# Patient Record
Sex: Female | Born: 1995 | Race: White | Hispanic: No | Marital: Single | Smoking: Never smoker
Health system: Southern US, Community
[De-identification: ages and names within clinical notes are randomized; demographics above are authoritative.]

## PROBLEM LIST (undated history)

## (undated) DIAGNOSIS — J45909 Unspecified asthma, uncomplicated: Secondary | ICD-10-CM

## (undated) HISTORY — PX: TONSILLECTOMY: SUR1361

---
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
## 2017-01-26 ENCOUNTER — Encounter: Payer: Self-pay | Admitting: Emergency Medicine

## 2017-01-26 ENCOUNTER — Emergency Department (INDEPENDENT_AMBULATORY_CARE_PROVIDER_SITE_OTHER): Payer: BLUE CROSS/BLUE SHIELD

## 2017-01-26 ENCOUNTER — Emergency Department (INDEPENDENT_AMBULATORY_CARE_PROVIDER_SITE_OTHER)
Admission: EM | Admit: 2017-01-26 | Discharge: 2017-01-26 | Disposition: A | Discharge status: Home / Self Care | Payer: BLUE CROSS/BLUE SHIELD | Source: Home / Self Care | Attending: Family Medicine | Admitting: Family Medicine

## 2017-01-26 DIAGNOSIS — M79671 Pain in right foot: Secondary | ICD-10-CM

## 2017-01-26 DIAGNOSIS — M79674 Pain in right toe(s): Secondary | ICD-10-CM | POA: Diagnosis not present

## 2017-01-26 DIAGNOSIS — R1032 Left lower quadrant pain: Secondary | ICD-10-CM

## 2017-01-26 LAB — POCT URINALYSIS DIP (MANUAL ENTRY)
BILIRUBIN UA: NEGATIVE mg/dL
Bilirubin, UA: NEGATIVE
Blood, UA: NEGATIVE
Glucose, UA: NEGATIVE mg/dL
LEUKOCYTES UA: NEGATIVE
Nitrite, UA: POSITIVE — AB
Protein Ur, POC: NEGATIVE mg/dL
Spec Grav, UA: 1.02 (ref 1.010–1.025)
Urobilinogen, UA: 0.2 E.U./dL
pH, UA: 7 (ref 5.0–8.0)

## 2017-01-26 LAB — POCT URINE PREGNANCY: Preg Test, Ur: NEGATIVE

## 2017-01-26 NOTE — ED Provider Notes (Signed)
[facility]    CSN: [id number] Arrival date & time: 01/26/17  1155     History   Chief Complaint Chief Complaint  Patient presents with  . Foot Pain  . Abdominal Pain    HPI Karen Huffman is a 21 y.o. female.   Patient presents with two complaints: 1)  She has had pain in the medial aspect of her right great toe for about three weeks.  She recalls no injury to her toe. 2)  She complains of vague intermittent left lower abdominal pain/cramps for about 5 weeks.  The pain seems to be worse with certain movements.  She denies urinary symptoms.  No fevers, chills, and sweats.  No nausea/vomiting.  No vaginal discharge.  Patient's last menstrual period was 01/15/2017.     The history is provided by the patient and a parent.    Past Medical History:  Diagnosis Date  . Asthma     There are no active problems to display for this patient.   Past Surgical History:  Procedure Laterality Date  . TONSILLECTOMY      OB History    No data available       Home Medications    Prior to Admission medications   Not on File    Family History Family History  Problem Relation Age of Onset  . Narcolepsy Mother     Social History Social History  Substance Use Topics  . Smoking status: Never Smoker  . Smokeless tobacco: Never Used  . Alcohol use No     Allergies   Patient has no known allergies.   Review of Systems Review of Systems  Constitutional: Negative for activity change, appetite change, chills, diaphoresis, fatigue and fever.  HENT: Negative.   Eyes: Negative.   Respiratory: Negative.   Cardiovascular: Negative.   Gastrointestinal: Positive for abdominal pain. Negative for abdominal distention, blood in stool, constipation, diarrhea, nausea, rectal pain and vomiting.  Genitourinary: Negative.   Musculoskeletal:       Right great toe pain     Physical Exam Triage Vital Signs ED Triage Vitals  Enc Vitals Group     BP 01/26/17 1247  110/72     Pulse Rate 01/26/17 1247 65     Resp --      Temp 01/26/17 1247 98.3 F (36.8 C)     Temp Source 01/26/17 1247 Oral     SpO2 01/26/17 1247 100 %     Weight 01/26/17 1247 125 lb (56.7 kg)     Height 01/26/17 1247 5\' 11"  (1.803 m)     Head Circumference --      Peak Flow --      Pain Score 01/26/17 1248 8     Pain Loc --      Pain Edu? --      Excl. in GC? --    No data found.   Updated Vital Signs BP 110/72 (BP Location: Left Arm)   Pulse 65   Temp 98.3 F (36.8 C) (Oral)   Ht 5\' 11"  (1.803 m)   Wt 125 lb (56.7 kg)   LMP 01/15/2017   SpO2 100%   BMI 17.43 kg/m   Visual Acuity Right Eye Distance:   Left Eye Distance:   Bilateral Distance:    Right Eye Near:   Left Eye Near:    Bilateral Near:     Physical Exam  Constitutional: She appears well-developed and well-nourished. No distress.  HENT:  Head: Normocephalic.  Eyes: Conjunctivae are normal. Pupils are equal, round, and reactive to light.  Neck: Normal range of motion.  Cardiovascular: Normal heart sounds.   Pulmonary/Chest: Breath sounds normal.  Abdominal: Soft. Bowel sounds are normal. She exhibits no distension and no mass. There is tenderness. There is no rebound and no guarding. No hernia.    There is almost point tenderness in patient's left lower abdomen as noted on diagram.   I can reproduce her pain only by palpation of her lower left abdomen during contraction of her rectus abdominus muscles.   Musculoskeletal:       Feet:  There is distinct tenderness to palpation over the medial aspect of the right first MTP joint.  There is no swelling, erythema, or warmth.  Toe has full range of motion.  Skin: Skin is warm and dry.  Nursing note and vitals reviewed.    UC Treatments / Results  Labs (all labs ordered are listed, but only abnormal results are displayed) Labs Reviewed  POCT URINALYSIS DIP (MANUAL ENTRY) - Abnormal; Notable for the following:       Result Value   Clarity, UA  cloudy (*)    Nitrite, UA Positive (*)    All other components within normal limits  POCT URINE PREGNANCY negative    EKG  EKG Interpretation None       Radiology Dg Foot Complete Right  Result Date: 01/26/2017 CLINICAL DATA:  Initial evaluation for acute pain.  No injury. EXAM: RIGHT FOOT COMPLETE - 3+ VIEW COMPARISON:  None. FINDINGS: Small focus of heterotopic calcification seen at the medial aspect of the right first MTP joint. No significant associated soft tissue swelling. No underlying erosive or osteoarthritic changes within the adjacent right first MTP joint. No other acute osseous abnormality. No fracture or dislocation. No other soft tissue abnormality. Osseous mineralization normal. IMPRESSION: 1. Small focus of heterotopic calcification at the medial aspect of the right first MTP joint. Finding is nonspecific, with primary differential considerations including changes related to calcific tendinopathy, bursitis, or sequelae of remote trauma. No associated osteoarthritic or erosive changes within the underlying right first MTP joint. 2. Otherwise negative radiograph of the right foot. Electronically Signed   By: Rise MuBenjamin  McClintock M.D.   On: 01/26/2017 13:36    Procedures Procedures (including critical care time)  Medications Ordered in UC Medications - No data to display   Initial Impression / Assessment and Plan / UC Course  I have reviewed the triage vital signs and the nursing notes.  Pertinent labs & imaging results that were available during my care of the patient were reviewed by me and considered in my medical decision making (see chart for details).    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 Note positive nitrites on urinalysis; doubt UTI however (patient specifically denies urinary symptoms). Suspect that patient's left lower abdominal pain is musculoskeletal:  I can reproduce her pain only by palpation of her lower left abdomen  during contraction of her rectus abdominus muscles.  Small hernia also a possibility. May take Ibuprofen 200mg , 3 or 4 tabs every 8 hours with food, as needed. Followup with Dr. Rodney Langtonhomas Thekkekandam or Dr. Clementeen GrahamEvan Corey ([facility]) as soon as possible for further evaluation.    Final Clinical Impressions(s) / UC Diagnoses   Final diagnoses:  Pain of right great toe  Abdominal wall pain in left lower quadrant    New Prescriptions New Prescriptions   No medications on file  Lattie Haw, MD 02/05/17 1147

## 2017-01-26 NOTE — Discharge Instructions (Signed)
May take Ibuprofen 200mg, 3 or 4 tabs every 8 hours with food, as needed. °

## 2017-01-26 NOTE — ED Triage Notes (Signed)
[facility]. C/O LLQ Pain, also reports urinary symptoms as well. Patient reports pain in Right Great Toe, medial side.

## 2017-01-27 ENCOUNTER — Ambulatory Visit (INDEPENDENT_AMBULATORY_CARE_PROVIDER_SITE_OTHER): Payer: BLUE CROSS/BLUE SHIELD | Admitting: Sports Medicine

## 2017-01-27 ENCOUNTER — Encounter: Payer: Self-pay | Admitting: Sports Medicine

## 2017-01-27 DIAGNOSIS — M79674 Pain in right toe(s): Secondary | ICD-10-CM | POA: Insufficient documentation

## 2017-01-27 DIAGNOSIS — R1032 Left lower quadrant pain: Secondary | ICD-10-CM

## 2017-01-27 LAB — POCT URINALYSIS DIPSTICK
Bilirubin, UA: NEGATIVE
Glucose, UA: NEGATIVE
Nitrite, UA: NEGATIVE
Protein, UA: NEGATIVE
Spec Grav, UA: 1.025 (ref 1.010–1.025)
Urobilinogen, UA: 0.2 U/dL
pH, UA: 6 (ref 5.0–8.0)

## 2017-01-27 LAB — POCT URINE PREGNANCY: Preg Test, Ur: NEGATIVE

## 2017-01-27 MED ORDER — NITROFURANTOIN MONOHYD MACRO 100 MG PO CAPS: 100.0000 mg | ORAL_CAPSULE | Freq: Two times a day (BID) | ORAL | 0 refills | Status: DC

## 2017-01-27 MED ORDER — MELOXICAM 15 MG PO TABS: ORAL_TABLET | ORAL | 3 refills | Status: DC

## 2017-01-27 NOTE — Progress Notes (Signed)
   Subjective:    I'm seeing this patient as a consultation for:  Dr. Donna ChristenStephen Beese  CC: right great toe pain, abdominal wall pain  HPI: Presents with right great toe pain for 1 month. Does not recall a specific injury but states that she may have stubbed her toe and doesn't remember. She denies any swelling to significant pain to the lateral aspect of her first MTP on the right.  She was seen in urgent care yesterday and had foot x-rays which showed a heterotrophic calcification lateral to the first MTP joint.  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  She also has left lower quadrant pain that is been ongoing for the past month or so. She reports that last month and began about a week or so after her menses started.  LMP 01/15/17 and she is very regular.  She denies any urinary symptoms, hematuria, vaginal discharge.    Past medical history:  Negative.  See flowsheet/record as well for more information.  Surgical history: Negative.  See flowsheet/record as well for more information.  Family history: Negative.  See flowsheet/record as well for more information.  Social history: Negative.  See flowsheet/record as well for more information.  Allergies, and medications have been entered into the medical record, reviewed, and no changes needed.   Review of Systems: No headache, visual changes, nausea, vomiting, diarrhea, constipation, dizziness, abdominal pain, skin rash, fevers, chills, night sweats, weight loss, swollen lymph nodes, body aches, joint swelling, muscle aches, chest pain, shortness of breath, mood changes, visual or auditory hallucinations.   Objective:   General: Well Developed, well nourished, and in no acute distress.  Neuro/Psych: Alert and oriented x3, extra-ocular muscles intact, able to move all 4 extremities, sensation grossly intact. Skin: Warm and dry, no rashes noted.  Respiratory: Not using accessory muscles, speaking in full sentences, trachea midline.    Cardiovascular: Pulses palpable, no extremity edema. Abdomen: Does not appear distended. +TTP in LLQ, no rebound or guarding.  No abdominal wall pain.  No pain with resisted hip flexion. Negative CVA tenderness bilaterally  Ankle & Foot: No visible swelling, ecchymosis, erythema, ulcers, calluses, blister. Has protrusion of right 1st MTP Arch: Normal w/o pes cavus or planus  TTP at right lateral MTP joint Full in plantarflexion, dorsiflexion, inversion, and eversion of the foot; flexion and extension of the toes Strength: 5/5 in all directions. Sensation: intact Vascular: intact w/ dorsalis pedis & posterior tibialis pulses 2+    Impression and Recommendations:   This case required medical decision making of moderate complexity.  Abdominal pain, acute, left lower quadrant Symptoms are consistent with a gynecologic process, likely ovarian cyst. She did have some leukocytes and blood in her urinalysis, we will send this for culture. Urine pregnancy test is negative. Adding Macrobid. I'm also going to add a pelvic and transvaginal ultrasound.  Great toe pain, right Meloxicam, postop shoe, return in one month for this.

## 2017-01-27 NOTE — Assessment & Plan Note (Signed)
Symptoms are consistent with a gynecologic process, likely ovarian cyst. She did have some leukocytes and blood in her urinalysis, we will send this for culture. Urine pregnancy test is negative. Adding Macrobid. I'm also going to add a pelvic and transvaginal ultrasound.

## 2017-01-27 NOTE — Addendum Note (Signed)
Addended by: Baird KayUGLAS, Tyren Dugar M on: 01/27/2017 04:08 PM   Modules accepted: Orders

## 2017-01-27 NOTE — Assessment & Plan Note (Signed)
Meloxicam, postop shoe, return in one month for this.

## 2017-01-29 ENCOUNTER — Ambulatory Visit (INDEPENDENT_AMBULATORY_CARE_PROVIDER_SITE_OTHER): Payer: BLUE CROSS/BLUE SHIELD

## 2017-01-29 DIAGNOSIS — R1032 Left lower quadrant pain: Secondary | ICD-10-CM

## 2017-01-30 LAB — URINE CULTURE

## 2018-02-04 DIAGNOSIS — Z6820 Body mass index (BMI) 20.0-20.9, adult: Secondary | ICD-10-CM | POA: Diagnosis not present

## 2018-02-04 DIAGNOSIS — Z0000 Encounter for general adult medical examination without abnormal findings: Secondary | ICD-10-CM | POA: Diagnosis not present

## 2018-07-06 DIAGNOSIS — M7552 Bursitis of left shoulder: Secondary | ICD-10-CM | POA: Diagnosis not present

## 2018-09-01 DIAGNOSIS — J069 Acute upper respiratory infection, unspecified: Secondary | ICD-10-CM | POA: Diagnosis not present

## 2018-09-01 DIAGNOSIS — M542 Cervicalgia: Secondary | ICD-10-CM | POA: Diagnosis not present

## 2018-09-01 DIAGNOSIS — M545 Low back pain: Secondary | ICD-10-CM | POA: Diagnosis not present

## 2018-09-14 ENCOUNTER — Other Ambulatory Visit: Payer: Self-pay

## 2018-09-14 ENCOUNTER — Ambulatory Visit: Payer: BLUE CROSS/BLUE SHIELD | Attending: Orthopedic Surgery | Admitting: Physical Therapy

## 2018-09-14 DIAGNOSIS — R293 Abnormal posture: Secondary | ICD-10-CM | POA: Insufficient documentation

## 2018-09-14 DIAGNOSIS — M546 Pain in thoracic spine: Secondary | ICD-10-CM | POA: Diagnosis not present

## 2018-09-14 NOTE — Patient Instructions (Addendum)

## 2018-09-14 NOTE — Therapy (Signed)
[facility] [place] [address]  [address] [place], [place], [address] Phone: [phone]   Fax:  [phone]  Physical Therapy Evaluation  Patient Details  Name: Karen Huffman MRN: [id number] Date of Birth: 1995-12-16 Referring Provider (PT): Venita Lickahari Brooks, MD   Encounter Date: 09/14/2018  PT End of Session - 09/14/18 1307    Visit Number  1    Number of Visits  4    Date for PT Re-Evaluation  09/28/18    PT Start Time  1307    PT Stop Time  1358    PT Time Calculation (min)  51 min    Activity Tolerance  Patient tolerated treatment well    Behavior During Therapy  [facility] for tasks assessed/performed       Past Medical History:  Diagnosis Date  . Asthma     Past Surgical History:  Procedure Laterality Date  . TONSILLECTOMY      There were no vitals filed for this visit.   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 Starting last May, she starting noting pain in L scapular region. Tried taking a break from working out but pain came back when she went back to working out.  Has had random days with severe pain (up to 8/10) w/o known trigger. Pain has been better recently (1-2 months) but has been avoiding upper body work-out. Reports she also injured her neck while lifting ~2 months ago but saw a chiropracter and was better w/in ~1.5 wks.    Currently in Pain?  Yes    Pain Score  2     Pain Location  Thoracic    Pain Orientation  Left;Medial    Pain Descriptors / Indicators  Discomfort    Pain Type  Chronic pain    Pain Radiating Towards  n/a    Pain Onset  More than a month ago    Pain Frequency  Intermittent    Aggravating Factors   rows & lat pull downs, massage    Pain Relieving Factors  ice & heat    Effect of Pain on Daily Activities  limits upper body work-out         [facility] - 09/14/18 1307      Assessment   Medical Diagnosis  L  periscapular thoracic pain    Referring Provider (PT)  Venita Lickahari Brooks, MD    Onset Date/Surgical Date  --   May 2019   Hand Dominance  Right    Next MD Visit  after finished PT?    Prior Therapy  none      Balance Screen   Has the patient fallen in the past 6 months  No    Has the patient had a decrease in activity level because of a fear of falling?   No    Is the patient reluctant to leave their home because of a fear of falling?   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); wedding videographer    Leisure  working out (lifting - max weights up to 200# with deadlift & squats, upper body free weights 20-30#)      Posture/Postural Control   Posture/Postural Control  Postural limitations    Posture Comments  slightly  elevated fwd L shoulder relative to R      ROM / Strength   AROM / PROM / Strength  AROM;Strength      AROM   Overall AROM   Within functional limits for tasks performed    Overall AROM Comments  B shoulder ROM WNL    AROM Assessment Site  Cervical;Thoracic;Shoulder    Cervical Flexion  70    Cervical Extension  62    Cervical - Right Side Bend  31    Cervical - Left Side Bend  32    Cervical - Right Rotation  59    Cervical - Left Rotation  58      Strength   Strength Assessment Site  Shoulder    Right/Left Shoulder  Right;Left    Right Shoulder Flexion  5/5    Right Shoulder Extension  5/5    Right Shoulder ABduction  5/5    Right Shoulder Internal Rotation  5/5    Right Shoulder External Rotation  4+/5    Left Shoulder Flexion  5/5    Left Shoulder Extension  5/5    Left Shoulder ABduction  5/5    Left Shoulder Internal Rotation  5/5    Left Shoulder External Rotation  4+/5      Palpation   Palpation comment  Increased muscle tension & ttp in L upper/mid/lower traps, LS, thoracic paraspinals, rhomboids & pecs with multiple TPs  and taut band identified.                 Objective measurements completed on examination: See above findings.      [facility] Treatment/Exercise - 09/14/18 1307      Exercises   Exercises  Shoulder      Shoulder Exercises: Standing   Retraction  Both;10 reps;Theraband;Strengthening    Theraband Level (Shoulder Retraction)  Level 3 (Green)    Retraction Limitations  scap retraction + mini shoulder extension      Shoulder Exercises: Lawyertretch   Corner Stretch  30 seconds;1 rep   IT sales professionaleach   Corner Stretch Limitations  3 way doorway stretch    Cross Chest Stretch  30 seconds;2 reps    Cross Chest Stretch Limitations  L posterior capsule stretch    Other Shoulder Stretches  B rhomboid/middle & lower trap stretch in doorway (varying arm position) 3 x 30 sec    Other Shoulder Stretches  L UT stretch with slight overpressure 2 x 30 sec             PT Education - 09/14/18 1358    Education Details  PT eval findings, anticipated POC, info on DN & initial HEP    Person(s) Educated  Patient    Methods  Explanation;Demonstration;Handout    Comprehension  Verbalized understanding;Returned demonstration;Need further instruction          PT Long Term Goals - 09/14/18 1400      PT LONG TERM GOAL #1   Title  Independent with ongoing HEP    Status  New    Target Date  09/28/18      PT LONG TERM GOAL #2   Title  Patient to demonstrate appropriate posture and body mechanics needed for daily activities    Status  New    Target Date  09/28/18      PT LONG TERM GOAL #3   Title  Patient to return to working out with light weights/resisitance w/o limitation due to L periscapular thoracic pain  Status  New    Target Date  09/28/18             Plan - 09/14/18 1403    Clinical Impression Statement  Karen Huffman is a 22 y/o female who presents to OP PT for L periscapular thoracic pain originating in May 2019. Pain first noticed while working out during lifting  activities and machine exercises such as inclined rows and lat pull downs. Improved during rest break from working out but returned when she went back to lifting. She also reports random "episodes" where she will have pain up to 8/10 w/o triggering events. She does report recent instance of L sided neck strain also from working out/lifting but states this resolved after ~1.5 weeks following chiropractic adjustment. Cervical ROM slightly limited in side bending and rotation but B shoulder ROM WNL. B UE strength symmetrical with only mild weakness in B shoulder ER with decreased scapular activation noted with this as well as rows. Increased muscle tension & ttp in L upper/mid/lower traps, LS, thoracic paraspinals, rhomboids & pecs with multiple TPs and taut band identified. Averlee will benefit from skilled PT to address deficits listed to allow her to resume normal daily activities and workouts with decreased pain interference. Therapy duration limited due to returning to school at [employer] on 09/27/18 - will assess need for continued PT while at school at that time.    Clinical Presentation  Stable    Clinical Decision Making  Low    Rehab Potential  Good    Clinical Impairments Affecting Rehab Potential  limited time frame until pt returns to school at [employer]    PT Frequency  2x / week    PT Duration  2 weeks   pt returning to school at [employer] as of 09/27/18   PT Treatment/Interventions  Patient/family education;Neuromuscular re-education;Therapeutic exercise;Therapeutic activities;ADLs/Self Care Home Management;Manual techniques;Dry needling;Taping;Electrical Stimulation;Moist Heat;Cryotherapy;Iontophoresis 4mg /ml Dexamethasone    Consulted and Agree with Plan of Care  Patient       Patient will benefit from skilled therapeutic intervention in order to improve the following deficits and impairments:  Pain, Increased muscle spasms, Impaired flexibility, Postural dysfunction, Improper body mechanics, Impaired UE  functional use  Visit Diagnosis: Pain in thoracic spine  Abnormal posture     Problem List Patient Active Problem List   Diagnosis Date Noted  . Great toe pain, right 01/27/2017  . Abdominal pain, acute, left lower quadrant 01/27/2017    Marry Guan, PT, MPT 09/14/2018, 2:37 PM  [facility] [address]  [address] [place], [place], [address] Phone: [phone]   Fax:  [phone]  Name: Karen Huffman MRN: [id number] Date of Birth: 1996/01/29

## 2018-09-18 ENCOUNTER — Ambulatory Visit: Payer: BLUE CROSS/BLUE SHIELD | Attending: Orthopedic Surgery | Admitting: Physical Therapy

## 2018-09-18 ENCOUNTER — Encounter: Payer: Self-pay | Admitting: Physical Therapy

## 2018-09-18 DIAGNOSIS — M546 Pain in thoracic spine: Secondary | ICD-10-CM | POA: Diagnosis not present

## 2018-09-18 DIAGNOSIS — R293 Abnormal posture: Secondary | ICD-10-CM | POA: Diagnosis not present

## 2018-09-18 NOTE — Therapy (Addendum)
[facility] [place] [address]  [address] [place], [place], [address] Phone: [phone]   Fax:  [phone]  Physical Therapy Treatment  Patient Details  Name: Karen Huffman MRN: [id number] Date of Birth: 03-Jan-1996 Referring Provider (PT): Venita Lickahari Brooks, MD   Encounter Date: 09/18/2018  PT End of Session - 09/18/18 0803    Visit Number  2    Number of Visits  4    Date for PT Re-Evaluation  09/28/18    Authorization Type  BCBS    PT Start Time  0803    PT Stop Time  0903    PT Time Calculation (min)  60 min    Activity Tolerance  Patient tolerated treatment well    Behavior During Therapy  [facility] for tasks assessed/performed       Past Medical History:  Diagnosis Date  . Asthma     Past Surgical History:  Procedure Laterality Date  . TONSILLECTOMY      There were no vitals filed for this visit.  Subjective Assessment - 09/18/18 0806    Subjective  Pt reporting good compliance with HEP and feels that the stretches are really targeting her problem area. Notes less of the "catching" sensation she typically gets with a rowing motion after having completed the HEP a few times.    Currently in Pain?  No/denies    Pain Onset  More than a month ago                       [facility] Treatment/Exercise - 09/18/18 0803      Exercises   Exercises  Shoulder      Shoulder Exercises: Standing   Horizontal ABduction  Both;15 reps;Theraband;Strengthening    Theraband Level (Shoulder Horizontal ABduction)  Level 2 (Red)    Horizontal ABduction Limitations  standing against 6" FR on wall - cues for scap retraction    External Rotation  Both;15 reps;Theraband;Strengthening    Theraband Level (Shoulder External Rotation)  Level 2 (Red)    External Rotation Limitations  standing against 6" FR on wall - cues for scap retraction    Diagonals  Both;15 reps;Theraband;Strengthening    Theraband Level (Shoulder  Diagonals)  Level 2 (Red)    Diagonals Limitations  standing against 6" FR on wall - cues for scap retraction    Other Standing Exercises  Serratus roll-up on 6" FR on wall with red TB at forearms x15      Shoulder Exercises: ROM/Strengthening   UBE (Upper Arm Bike)  L3.0 x 6 min (3' fwd/3' back)    Wall Pushups  15 reps   2 sets    Wall Pushups Limitations  Serratus pushup on wall & push-up plus on orange Pball on wall - 1 set each      Shoulder Exercises: Stretch   Other Shoulder Stretches  B LS stretch x 30 sec    Other Shoulder Stretches  B UT stretch with slight overpressure 2 x 30 sec      Modalities   Modalities  Electrical Stimulation;Moist Heat      Moist Heat Therapy   Number Minutes Moist Heat  15 Minutes    Moist Heat Location  Shoulder   B UT & thoracic paraspinals     Electrical Stimulation   Electrical Stimulation Location  B UT & thoracic paraspinals    Electrical Stimulation Action  IFC    Electrical Stimulation Parameters  80-150 Hz, intensity to pt tol x 15'    Electrical Stimulation Goals  Pain;Tone        Manual Therapy    Manual Therapy Soft tissue mobilization;Myofascial release    Manual therapy comments prone & supine   Soft tissue mobilization B UT, LS, rhomboids & mid/lower traps    Myofascial Release manual TPR to B UT/LS & rhomboids       Trigger Point Dry Needling - 09/18/18 0803    Consent Given?  Yes    Education Handout Provided  Yes    Muscles Treated Upper Body  Upper trapezius;Levator scapulae   bilateral   Upper Trapezius Response  Twitch reponse elicited;Palpable increased muscle length    Levator Scapulae Response  Twitch response elicited;Palpable increased muscle length                PT Long Term Goals - 09/18/18 4782      PT LONG TERM GOAL #1   Title  Independent with ongoing HEP    Status  On-going      PT LONG TERM GOAL #2   Title  Patient to demonstrate appropriate posture and body mechanics needed for daily  activities    Status  On-going      PT LONG TERM GOAL #3   Title  Patient to return to working out with light weights/resisitance w/o limitation due to L periscapular thoracic pain    Status  On-going            Plan - 09/18/18 0809    Clinical Impression Statement  Adylen noting benefit from initial HEP with lessening pain and "catching" sensation/tightness in mid thoracic muscles, but did note increased awareness of tightness in UT while completing HEP. Pt denied need for review of HEP, therefore progressed periscapular strengthening/stabilization with some fatigue noted as pt reports she worked out upper body yesterday at the gym, but no increased pain reported. Increased muscle tension and acitve TPs identified in B UT, LS, rhomboids and thoracic paraspinals - addressed with manual therapy including DN to B UT & LS upon informed pt consent with positive twitch response elicited and palpable recuction in muscle tension. Treatment concluded with stretching follwed by estim with moist heat to promote further muscle relaxation.    Rehab Potential  Good    Clinical Impairments Affecting Rehab Potential  limited time frame until pt returns to school at [employer]    PT Frequency  2x / week    PT Duration  2 weeks   pt returning to school at [employer] as of 09/27/18   PT Treatment/Interventions  Patient/family education;Neuromuscular re-education;Therapeutic exercise;Therapeutic activities;ADLs/Self Care Home Management;Manual techniques;Dry needling;Taping;Electrical Stimulation;Moist Heat;Cryotherapy;Iontophoresis 4mg /ml Dexamethasone    PT Next Visit Plan  Assess response to DN - consider DN to rhomboids & thoracic paraspinals as indicated; Progress scapular strengthening & stabilization; Manual therapy & modalities PRN    Consulted and Agree with Plan of Care  Patient       Patient will benefit from skilled therapeutic intervention in order to improve the following deficits and impairments:  Pain,  Increased muscle spasms, Impaired flexibility, Postural dysfunction, Improper body mechanics, Impaired UE functional use  Visit Diagnosis: Pain in thoracic spine  Abnormal posture     Problem List Patient Active Problem List   Diagnosis Date Noted  . Great toe pain, right 01/27/2017  . Abdominal pain, acute, left lower quadrant 01/27/2017    Marry Guan, PT, MPT 09/18/2018, 10:49 AM  [facility] [address]  [address] [place], [place], [address] Phone: [phone]   Fax:  [phone]  Name: Karen Huffman MRN: [id number] Date of Birth: 1996-08-27

## 2018-09-21 ENCOUNTER — Encounter: Payer: Self-pay | Admitting: Physical Therapy

## 2018-09-21 ENCOUNTER — Ambulatory Visit: Payer: BLUE CROSS/BLUE SHIELD | Admitting: Physical Therapy

## 2018-09-21 DIAGNOSIS — M546 Pain in thoracic spine: Secondary | ICD-10-CM | POA: Diagnosis not present

## 2018-09-21 DIAGNOSIS — R293 Abnormal posture: Secondary | ICD-10-CM

## 2018-09-21 NOTE — Therapy (Signed)
[facility] [address]  [address] [place], [place], [address] Phone: [phone]   Fax:  [phone]  Physical Therapy Treatment  Patient Details  Name: Karen Huffman MRN: [id number] Date of Birth: Jul 23, 1996 Referring Provider (PT): Venita Lick, MD   Encounter Date: 09/21/2018  PT End of Session - 09/21/18 1017    Visit Number  3    Number of Visits  4    Date for PT Re-Evaluation  09/28/18    Authorization Type  BCBS    PT Start Time  1017    PT Stop Time  1118    PT Time Calculation (min)  61 min    Activity Tolerance  Patient tolerated treatment well    Behavior During Therapy  [facility] for tasks assessed/performed       Past Medical History:  Diagnosis Date  . Asthma     Past Surgical History:  Procedure Laterality Date  . TONSILLECTOMY      There were no vitals filed for this visit.  Subjective Assessment - 09/21/18 1018    Subjective  Pt noting good reduction in upper trap tension following DN last session. Feels like she is doing better - no longer having the radiating pain she used to have, but still feeling the intermittent "pinch" in her L periscapular area. Still noted greatest benefit from posterior capsule stretch.    Currently in Pain?  No/denies    Pain Onset  More than a month ago                       [facility] Adult PT Treatment/Exercise - 09/21/18 1017      Exercises   Exercises  Shoulder      Shoulder Exercises: Prone   Extension  Both;10 reps;Strengthening    Extension Limitations  I's over green Pball    External Rotation  Both;10 reps;Strengthening    External Rotation Limitations  W's over green Pball    Horizontal ABduction 1  Both;10 reps;Strengthening    Horizontal ABduction 1 Limitations  T's over green Pball    Horizontal ABduction 2  Both;10 reps;Strengthening    Horizontal ABduction 2 Limitations  Y's over green Pball      Shoulder Exercises: Standing   Horizontal ABduction  Both;15 reps;Theraband;Strengthening    Theraband Level (Shoulder Horizontal ABduction)  Level 2 (Red)    Horizontal ABduction Limitations  standing against 6" FR on wall - cues for scap retraction    External Rotation  Both;15 reps;Theraband;Strengthening    Theraband Level (Shoulder External Rotation)  Level 2 (Red)    External Rotation Limitations  standing against 6" FR on wall - cues for scap retraction    Diagonals  Both;15 reps;Theraband;Strengthening    Theraband Level (Shoulder Diagonals)  Level 2 (Red)    Diagonals Limitations  standing against 6" FR on wall - cues for scap retraction      Shoulder Exercises: ROM/Strengthening   UBE (Upper Arm Bike)  L3.0 x 6 min (3' fwd/3' back)      Shoulder Exercises: Stretch   Cross Chest Stretch  30 seconds;2 reps    Cross Chest Stretch Limitations  L posterior capsule stretch    Other Shoulder Stretches  B rhomboid/middle & lower trap stretch holding onto BACTA upright (varying arm position) 3 x 30 sec      Modalities   Modalities  Electrical Stimulation;Moist Heat      Moist Heat Therapy  Number Minutes Moist Heat  15 Minutes    Moist Heat Location  Other (comment)   B thoracic paraspinals     Electrical Stimulation   Electrical Stimulation Location  B thoracic paraspinals    Electrical Stimulation Action  IFC    Electrical Stimulation Parameters  80-150 Hz, intensity to pt tol x 15'    Electrical Stimulation Goals  Pain;Tone      Manual Therapy   Manual Therapy  Soft tissue mobilization;Myofascial release    Manual therapy comments  prone    Soft tissue mobilization  B rhomboids & mid/lower traps (L>R) & L subscapularis    Myofascial Release  manual TPR to L rhomboids & subscapularis       Trigger Point Dry Needling - 09/21/18 1017    Consent Given?  Yes    Muscles Treated Upper Body  Rhomboids;Subscapularis   Left   Rhomboids Response  Twitch response elicited;Palpable increased muscle length     Subscapularis Response  Twitch response elicited;Palpable increased muscle length           PT Education - 09/21/18 1115    Education Details  Update HEP - red TB resisted scap retraction & I/T/Y/W's over 65 cm Pball    Person(s) Educated  Patient    Methods  Explanation;Demonstration;Handout    Comprehension  Verbalized understanding;Returned demonstration          PT Long Term Goals - 09/18/18 0808      PT LONG TERM GOAL #1   Title  Independent with ongoing HEP    Status  On-going      PT LONG TERM GOAL #2   Title  Patient to demonstrate appropriate posture and body mechanics needed for daily activities    Status  On-going      PT LONG TERM GOAL #3   Title  Patient to return to working out with light weights/resisitance w/o limitation due to L periscapular thoracic pain    Status  On-going            Plan - 09/21/18 1022    Clinical Impression Statement  [facility] good relief of increased muscle tension/tightness in her traps following manual therapy with DN last session, and no longer experiencing the radiatiing pain with only intermittent "pinching" pain with certain motions. Active TPs indentified in L rhomboids and subscapularis today - treated with manual therapy including DN with strong twitch response and significant reduction in muscle tension and ttp. Manual therapy followed by relevant stretching and scapular strengthening stabilization with HEP updated at pt request. Pt returning to school [facility] [employer] as of the coming weekend, therefore will plan to review/update of HEP as indicated on final visit later this week.    Rehab Potential  Good    Clinical Impairments Affecting Rehab Potential  limited time frame until pt returns to school [facility] [employer]    PT Frequency  2x / week    PT Duration  2 weeks   pt returning to school at [employer] as of 09/27/18   PT Treatment/Interventions  Patient/family education;Neuromuscular re-education;Therapeutic exercise;Therapeutic  activities;ADLs/Self Care Home Management;Manual techniques;Dry needling;Taping;Electrical Stimulation;Moist Heat;Cryotherapy;Iontophoresis 4mg /ml Dexamethasone    PT Next Visit Plan  Discharge assessment; Review/update HEP as needed; Manual therapy including DN as indicated & modalities PRN    Consulted and Agree with Plan of Care  Patient       Patient will benefit from skilled therapeutic intervention in order to improve the following deficits and impairments:  Pain, Increased  muscle spasms, Impaired flexibility, Postural dysfunction, Improper body mechanics, Impaired UE functional use  Visit Diagnosis: Pain in thoracic spine  Abnormal posture     Problem List Patient Active Problem List   Diagnosis Date Noted  . Great toe pain, right 01/27/2017  . Abdominal pain, acute, left lower quadrant 01/27/2017    Marry GuanJoAnne M , PT, MPT 09/21/2018, 11:34 AM  [facility] [place] [address]  [address] [place], [place], [address] Phone: [phone]   Fax:  [phone]  Name: Iva LentoSavannah L Mchaffie MRN: [id number] Date of Birth: 1995-11-23

## 2018-09-24 ENCOUNTER — Ambulatory Visit: Payer: BLUE CROSS/BLUE SHIELD | Admitting: Physical Therapy

## 2018-09-24 ENCOUNTER — Encounter: Payer: Self-pay | Admitting: Physical Therapy

## 2018-09-24 DIAGNOSIS — R293 Abnormal posture: Secondary | ICD-10-CM

## 2018-09-24 DIAGNOSIS — M546 Pain in thoracic spine: Secondary | ICD-10-CM

## 2018-09-24 NOTE — Therapy (Signed)
[facility] [address]  [place], [place], [address] Phone: [phone]   Fax:  [phone]  Physical Therapy Treatment / Discharge Summary  Patient Details  Name: Karen Huffman MRN: [id number] Date of Birth: 02/05/1996 Referring Provider (PT): Melina Schools, MD   Encounter Date: 09/24/2018  PT End of Session - 09/24/18 1101    Visit Number  4    Number of Visits  4    Date for PT Re-Evaluation  09/28/18    Authorization Type  BCBS    PT Start Time  1101    PT Stop Time  1127    PT Time Calculation (min)  26 min    Activity Tolerance  Patient tolerated treatment well    Behavior During Therapy  [facility] for tasks assessed/performed       Past Medical History:  Diagnosis Date  . Asthma     Past Surgical History:  Procedure Laterality Date  . TONSILLECTOMY      There were no vitals filed for this visit.  Subjective Assessment - 09/24/18 1104    Subjective  Pt reportng good relief from DN with "no pain in back whatsoever today" but does note some pain in her neck today which she attributes to how she slept the other night.    Currently in Pain?  Yes    Pain Score  3     Pain Location  Neck    Pain Orientation  Left;Upper    Pain Descriptors / Indicators  --   "head feels heavy on L"   Pain Type  Acute pain    Pain Onset  In the past 7 days         [facility] PT Assessment - 09/24/18 1101      Assessment   Medical Diagnosis  L periscapular thoracic pain    Referring Provider (PT)  Melina Schools, MD    Onset Date/Surgical Date  --   May 2019   Hand Dominance  Right    Next MD Visit  PRN      AROM   Cervical Flexion  68    Cervical Extension  67    Cervical - Right Side Bend  32    Cervical - Left Side Bend  32    Cervical - Right Rotation  68    Cervical - Left Rotation  67      Strength   Right Shoulder Flexion  5/5    Right Shoulder Extension  5/5    Right Shoulder ABduction  5/5    Right Shoulder Internal Rotation  5/5    Right Shoulder External Rotation  5/5    Left Shoulder Flexion  5/5    Left Shoulder Extension  5/5    Left Shoulder ABduction  5/5    Left Shoulder Internal Rotation  5/5    Left Shoulder External Rotation  5/5                   [facility] Adult PT Treatment/Exercise - 09/24/18 0001      Manual Therapy   Manual Therapy  Soft tissue mobilization;Myofascial release    Manual therapy comments  supine    Soft tissue mobilization  B cervical paraspinals & suboccipitals    Myofascial Release  B suboccipital release & TPR to oblique capitus inferior                  PT Long Term Goals -  09/24/18 1108      PT LONG TERM GOAL #1   Title  Independent with ongoing HEP    Status  Achieved      PT LONG TERM GOAL #2   Title  Patient to demonstrate appropriate posture and body mechanics needed for daily activities    Status  Achieved      PT LONG TERM GOAL #3   Title  Patient to return to working out with light weights/resisitance w/o limitation due to L periscapular thoracic pain    Status  Achieved            Plan - 09/24/18 [place] reporting complete relief of thoracic back pain following last session and has remained pain free in this area since, however notes new L sided upper neck pain for the past few days which she attributes to how she slept a few nights ago. Neck pain isolated to upper cervical spine and subocciptals with pt reporting migraine headaches for the past 2 days - pt stating migraines are not typical for her. No migraine today but still with some discomfort/heaviness noted in L upper neck - increased muscle tension noted in L suboccipitals and B oblique capitus inferior which was addressed with STM and MFR including suboccipital release with pt noting improvement after this. Provided instruction in use of tennis balls for self release for the suboccipitals. Cervical ROM  improved upon reassessment today and B shoulder strength 5/5. [place] reporting no concerns with HEP and denies need for futher review. States she has been able to resume her upper body workouts without any further thoracic back pain. All goals met for this episode and pt will be heading back to [employer] for school this weekend, therefore will proceed with discharge from PT for this episode.    Rehab Potential  Good    Clinical Impairments Affecting Rehab Potential  limited time frame until pt returns to school at [employer]    PT Treatment/Interventions  Patient/family education;Neuromuscular re-education;Therapeutic exercise;Therapeutic activities;ADLs/Self Care Home Management;Manual techniques;Dry needling;Taping;Electrical Stimulation;Moist Heat;Cryotherapy;Iontophoresis 55m/ml Dexamethasone    PT Next Visit Plan  Discharge     Consulted and Agree with Plan of Care  Patient       Patient will benefit from skilled therapeutic intervention in order to improve the following deficits and impairments:  Pain, Increased muscle spasms, Impaired flexibility, Postural dysfunction, Improper body mechanics, Impaired UE functional use  Visit Diagnosis: Pain in thoracic spine  Abnormal posture     Problem List Patient Active Problem List   Diagnosis Date Noted  . Great toe pain, right 01/27/2017  . Abdominal pain, acute, left lower quadrant 01/27/2017    PHYSICAL THERAPY DISCHARGE SUMMARY  Visits from Start of Care: 4  Current functional level related to goals / functional outcomes:   Refer to above clinical impression.   Remaining deficits:   As above.   Education / Equipment:   HEP, Postural education, Dry needling education  Plan: Patient agrees to discharge.  Patient goals were met. Patient is being discharged due to meeting the stated rehab goals.  ?????      JPercival Spanish PT, MPT 09/24/2018, 11:56 AM  [facility] [place] [address]Phone: [phone]  Fax:  [phone] Name: SRAENA PAUMRN: [id number] of Birth: 6Sep 05, 1997

## 2018-10-13 DIAGNOSIS — A084 Viral intestinal infection, unspecified: Secondary | ICD-10-CM | POA: Diagnosis not present

## 2018-10-13 DIAGNOSIS — Z793 Long term (current) use of hormonal contraceptives: Secondary | ICD-10-CM | POA: Diagnosis not present

## 2018-10-13 DIAGNOSIS — K529 Noninfective gastroenteritis and colitis, unspecified: Secondary | ICD-10-CM | POA: Diagnosis not present

## 2018-10-13 DIAGNOSIS — R1031 Right lower quadrant pain: Secondary | ICD-10-CM | POA: Diagnosis not present

## 2019-01-19 ENCOUNTER — Ambulatory Visit (INDEPENDENT_AMBULATORY_CARE_PROVIDER_SITE_OTHER): Payer: BLUE CROSS/BLUE SHIELD | Admitting: Internal Medicine

## 2019-01-19 ENCOUNTER — Encounter: Payer: Self-pay | Admitting: Internal Medicine

## 2019-01-19 ENCOUNTER — Other Ambulatory Visit: Payer: Self-pay

## 2019-01-19 ENCOUNTER — Other Ambulatory Visit (INDEPENDENT_AMBULATORY_CARE_PROVIDER_SITE_OTHER): Payer: BLUE CROSS/BLUE SHIELD

## 2019-01-19 VITALS — BP 120/80 | HR 88 | Temp 99.5°F | Resp 16 | Ht 67.75 in | Wt 135.0 lb

## 2019-01-19 DIAGNOSIS — Z23 Encounter for immunization: Secondary | ICD-10-CM

## 2019-01-19 DIAGNOSIS — R21 Rash and other nonspecific skin eruption: Secondary | ICD-10-CM | POA: Insufficient documentation

## 2019-01-19 DIAGNOSIS — B079 Viral wart, unspecified: Secondary | ICD-10-CM | POA: Diagnosis not present

## 2019-01-19 DIAGNOSIS — L2082 Flexural eczema: Secondary | ICD-10-CM | POA: Insufficient documentation

## 2019-01-19 DIAGNOSIS — D801 Nonfamilial hypogammaglobulinemia: Secondary | ICD-10-CM

## 2019-01-19 DIAGNOSIS — R590 Localized enlarged lymph nodes: Secondary | ICD-10-CM

## 2019-01-19 LAB — CBC WITH DIFFERENTIAL/PLATELET
Basophils Absolute: 0.1 10*3/uL (ref 0.0–0.1)
Basophils Relative: 0.8 % (ref 0.0–3.0)
Eosinophils Absolute: 0.1 10*3/uL (ref 0.0–0.7)
Eosinophils Relative: 1.4 % (ref 0.0–5.0)
HCT: 45.8 % (ref 36.0–46.0)
Hemoglobin: 15.8 g/dL — ABNORMAL HIGH (ref 12.0–15.0)
Lymphocytes Relative: 22.9 % (ref 12.0–46.0)
Lymphs Abs: 2.3 10*3/uL (ref 0.7–4.0)
MCHC: 34.5 g/dL (ref 30.0–36.0)
MCV: 86.1 fl (ref 78.0–100.0)
Monocytes Absolute: 0.7 10*3/uL (ref 0.1–1.0)
Monocytes Relative: 7.1 % (ref 3.0–12.0)
Neutro Abs: 6.8 10*3/uL (ref 1.4–7.7)
Neutrophils Relative %: 67.8 % (ref 43.0–77.0)
Platelets: 159 10*3/uL (ref 150.0–400.0)
RBC: 5.32 Mil/uL — ABNORMAL HIGH (ref 3.87–5.11)
RDW: 12.7 % (ref 11.5–14.6)
WBC: 10 10*3/uL (ref 4.5–10.5)

## 2019-01-19 LAB — COMPREHENSIVE METABOLIC PANEL
ALT: 22 U/L (ref 0–35)
AST: 21 U/L (ref 0–37)
Albumin: 4.3 g/dL (ref 3.5–5.2)
Alkaline Phosphatase: 52 U/L (ref 39–117)
BUN: 11 mg/dL (ref 6–23)
CO2: 24 mEq/L (ref 19–32)
Calcium: 9.4 mg/dL (ref 8.4–10.5)
Chloride: 103 mEq/L (ref 96–112)
Creatinine, Ser: 0.79 mg/dL (ref 0.40–1.20)
GFR: 91.99 mL/min (ref >60.00)
Glucose, Bld: 84 mg/dL (ref 70–99)
Potassium: 4 mEq/L (ref 3.5–5.1)
Sodium: 136 mEq/L (ref 135–145)
Total Bilirubin: 0.5 mg/dL (ref 0.2–1.2)
Total Protein: 6.8 g/dL (ref 6.0–8.3)

## 2019-01-19 LAB — SEDIMENTATION RATE: Sed Rate: 3 mm/hr (ref 0–20)

## 2019-01-19 MED ORDER — FLUOCINONIDE 0.05 % EX CREA: 1.0000 "application " | TOPICAL_CREAM | Freq: Two times a day (BID) | CUTANEOUS | 2 refills | Status: DC

## 2019-01-19 MED ORDER — LEVOCETIRIZINE DIHYDROCHLORIDE 5 MG PO TABS: 5.0000 mg | ORAL_TABLET | Freq: Every evening | ORAL | 1 refills | Status: DC

## 2019-01-19 NOTE — Patient Instructions (Signed)

## 2019-01-19 NOTE — Progress Notes (Signed)
Subjective:  Patient ID: Karen Huffman, female    DOB: Jan 04, 1996  Age: 23 y.o. MRN: [id number]  CC: Lymphadenopathy (bilateral axillary areas x 1 week. L > R) and Rash (on bilateral arms/legs x 1 week)  NEW TO ME  HPI Karen Huffman presents for a several week history of itchy rash on her upper and lower extremities.  She has had this before but says it is more severe now than ever.  She is treating it with over-the-counter products.  She is not taking an antihistamine.  She also complains of a several week history of painful lymph nodes in her armpits.  She denies night sweats, sore throat, fever, chills, weight loss, sore throats, abdominal pain, or diaphoresis.  History Karen Huffman has a past medical history of Asthma.   She has a past surgical history that includes Tonsillectomy.   Her family history includes Narcolepsy in her mother.She reports that she has never smoked. She has never used smokeless tobacco. She reports that she does not drink alcohol or use drugs.  Outpatient Medications Prior to Visit  Medication Sig Dispense Refill  . CRYSELLE-28 0.3-30 MG-MCG tablet Take 1 tablet by mouth daily.    . meloxicam (MOBIC) 15 MG tablet One tab PO qAM with breakfast for 2 weeks, then daily prn pain. (Patient not taking: Reported on 01/19/2019) 30 tablet 3  . nitrofurantoin, macrocrystal-monohydrate, (MACROBID) 100 MG capsule Take 1 capsule (100 mg total) by mouth 2 (two) times daily. (Patient not taking: Reported on 01/19/2019) 14 capsule 0  . norethindrone-ethinyl estradiol-iron (MICROGESTIN FE,GILDESS FE,LOESTRIN FE) 1.5-30 MG-MCG tablet Take 1 tablet by mouth daily.     No facility-administered medications prior to visit.     ROS Review of Systems  Constitutional: Negative for appetite change, chills, diaphoresis, fatigue and fever.  HENT: Negative for sore throat and trouble swallowing.   Respiratory: Negative for cough and shortness of breath.   Cardiovascular: Negative  for chest pain.  Gastrointestinal: Negative for abdominal pain, diarrhea, nausea and vomiting.  Genitourinary: Negative.  Negative for difficulty urinating and dysuria.  Musculoskeletal: Negative for arthralgias and myalgias.  Skin: Positive for rash. Negative for color change.  Neurological: Negative.  Negative for dizziness, weakness and light-headedness.  Hematological: Positive for adenopathy. Does not bruise/bleed easily.  Psychiatric/Behavioral: Negative.     Objective:  BP 120/80 (BP Location: Right Arm, Patient Position: Sitting, Cuff Size: Normal)   Pulse 88   Temp 99.5 F (37.5 C) (Oral)   Resp 16   Ht 5' 7.75" (1.721 m)   Wt 135 lb (61.2 kg)   LMP 12/30/2018 (Exact Date)   SpO2 97%   BMI 20.68 kg/m   Physical Exam Constitutional:      Appearance: She is not ill-appearing or diaphoretic.  HENT:     Mouth/Throat:     Mouth: Mucous membranes are moist.     Pharynx: No oropharyngeal exudate or posterior oropharyngeal erythema.  Eyes:     General: No scleral icterus.    Conjunctiva/sclera: Conjunctivae normal.  Neck:     Musculoskeletal: No muscular tenderness.  Cardiovascular:     Rate and Rhythm: Normal rate and regular rhythm.     Pulses: Normal pulses.  Pulmonary:     Effort: No respiratory distress.     Breath sounds: No stridor. No wheezing, rhonchi or rales.  Abdominal:     General: Abdomen is flat.     Palpations: There is no hepatomegaly, splenomegaly or mass.     Tenderness:  There is no abdominal tenderness.  Musculoskeletal:     Right lower leg: No edema.     Left lower leg: No edema.       Legs:  Lymphadenopathy:     Head:     Right side of head: No occipital adenopathy.     Left side of head: No occipital adenopathy.     Cervical: Cervical adenopathy present.     Right cervical: Superficial cervical adenopathy present. No deep or posterior cervical adenopathy.    Left cervical: Superficial cervical adenopathy present. No deep or posterior  cervical adenopathy.     Upper Body:     Right upper body: Axillary adenopathy present. No supraclavicular, pectoral or epitrochlear adenopathy.     Left upper body: Supraclavicular adenopathy and axillary adenopathy present. No pectoral or epitrochlear adenopathy.     Lower Body: Right inguinal adenopathy present. Left inguinal adenopathy present.     Comments: There is minimal, shoddy lymphadenopathy in the cervical, axillary, and inguinal regions.  None of the lymph nodes measure greater than 1 to 2 cm.  The only lymph node that was tender was in the left axillary region.  Skin:    General: Skin is warm and moist.     Findings: Rash present. No bruising or ecchymosis. Rash is macular.     Comments: There are patches/macules with scale, lichenification, and some areas of excoriation and depigmentation noted on the volar sides of both forearms, into the flexural creases of the elbows and over the upper extremities.  Similar lesions are noted on the right lower extremity around and distal to the knee.  There is one area over the RLL anteriorly just beyond the knee that is  excoriated with scab formation.  None of the areas show erythema, exudate, induration, or fluctuance.  Neurological:     General: No focal deficit present.     Mental Status: She is alert.  Psychiatric:        Mood and Affect: Mood normal.        Behavior: Behavior normal.       Assessment & Plan:   Karen Huffman was seen today for lymphadenopathy and rash.  Diagnoses and all orders for this visit:  Flexural eczema -     fluocinonide cream (LIDEX) 0.05 %; Apply 1 application topically 2 (two) times daily. -     levocetirizine (XYZAL) 5 MG tablet; Take 1 tablet (5 mg total) by mouth every evening.  Viral warts, unspecified type- These were treated with cryotherapy and Gardasil 9 immunization.  She was asked to return in 3 to 4 weeks to see if the warts need to be treated again and for her first booster on Gardasil 9.   LAD (lymphadenopathy), axillary- She has symptoms, minimally palpable lymphadenopathy, a slightly elevated white cell count, and a low gammaglobulin level.  I have asked her to see hematology to consider evaluation for lymphoproliferative disease. -     Lactate dehydrogenase; Future -     CBC with Differential/Platelet; Future -     Comprehensive metabolic panel; Future -     Protein electrophoresis, serum; Future -     Sedimentation rate; Future -     HIV Antibody (routine testing w rflx); Future -     RPR; Future -     Ambulatory referral to Hematology  Rash -     Sedimentation rate; Future -     RPR; Future  Need for HPV vaccination -     HPV 9-valent  vaccine,Recombinat  Low gammaglobulin level (HCC) -     Ambulatory referral to Hematology   I have discontinued Stefan L. Howser's nitrofurantoin (macrocrystal-monohydrate), meloxicam, and norethindrone-ethinyl estradiol-iron. I am also having her start on fluocinonide cream and levocetirizine. Additionally, I am having her maintain her Cryselle-28.  Meds ordered this encounter  Medications  . fluocinonide cream (LIDEX) 0.05 %    Sig: Apply 1 application topically 2 (two) times daily.    Dispense:  120 g    Refill:  2  . levocetirizine (XYZAL) 5 MG tablet    Sig: Take 1 tablet (5 mg total) by mouth every evening.    Dispense:  90 tablet    Refill:  1     Follow-up: Return in about 4 weeks (around 02/16/2019).  Sanda Linger, MD

## 2019-01-21 ENCOUNTER — Encounter: Payer: Self-pay | Admitting: Internal Medicine

## 2019-01-21 DIAGNOSIS — D801 Nonfamilial hypogammaglobulinemia: Secondary | ICD-10-CM | POA: Insufficient documentation

## 2019-01-21 LAB — PROTEIN ELECTROPHORESIS, SERUM
Albumin ELP: 3.9 g/dL (ref 3.8–4.8)
Alpha 1: 0.3 g/dL (ref 0.2–0.3)
Alpha 2: 0.7 g/dL (ref 0.5–0.9)
Beta 2: 0.3 g/dL (ref 0.2–0.5)
Beta Globulin: 0.5 g/dL (ref 0.4–0.6)
Gamma Globulin: 0.6 g/dL — ABNORMAL LOW (ref 0.8–1.7)
Total Protein: 6.3 g/dL (ref 6.1–8.1)

## 2019-01-21 LAB — RPR: RPR Ser Ql: NONREACTIVE

## 2019-01-21 LAB — LACTATE DEHYDROGENASE: LDH: 151 U/L (ref 100–200)

## 2019-01-21 LAB — HIV ANTIBODY (ROUTINE TESTING W REFLEX): HIV 1&2 Ab, 4th Generation: NONREACTIVE

## 2019-01-27 ENCOUNTER — Telehealth: Payer: Self-pay | Admitting: Family

## 2019-01-27 NOTE — Telephone Encounter (Signed)
Spoke with patient to confirm new patient appt 6/4 at 0830. Pt awar of location/date/time

## 2019-02-12 ENCOUNTER — Telehealth: Payer: Self-pay | Admitting: Family

## 2019-02-12 NOTE — Telephone Encounter (Signed)
Patient called to confirm appts for 6/2

## 2019-02-12 NOTE — Telephone Encounter (Signed)
Called and LMVM for patient regarding r/s 6/4 appt to 6/2 due to provider out on PAL.  I asked her to call the office back to confirm this appt date/time

## 2019-02-15 ENCOUNTER — Other Ambulatory Visit: Payer: Self-pay | Admitting: Family

## 2019-02-15 DIAGNOSIS — D801 Nonfamilial hypogammaglobulinemia: Secondary | ICD-10-CM

## 2019-02-16 ENCOUNTER — Inpatient Hospital Stay: Payer: BLUE CROSS/BLUE SHIELD | Attending: Family

## 2019-02-16 ENCOUNTER — Telehealth: Payer: Self-pay | Admitting: Family

## 2019-02-16 ENCOUNTER — Inpatient Hospital Stay (HOSPITAL_BASED_OUTPATIENT_CLINIC_OR_DEPARTMENT_OTHER): Payer: BLUE CROSS/BLUE SHIELD | Admitting: Family

## 2019-02-16 ENCOUNTER — Other Ambulatory Visit: Payer: Self-pay

## 2019-02-16 ENCOUNTER — Other Ambulatory Visit: Payer: Self-pay | Admitting: Family

## 2019-02-16 ENCOUNTER — Encounter: Payer: Self-pay | Admitting: Family

## 2019-02-16 VITALS — BP 103/79 | HR 88 | Temp 98.1°F | Wt 139.4 lb

## 2019-02-16 DIAGNOSIS — J45909 Unspecified asthma, uncomplicated: Secondary | ICD-10-CM | POA: Diagnosis not present

## 2019-02-16 DIAGNOSIS — R109 Unspecified abdominal pain: Secondary | ICD-10-CM

## 2019-02-16 DIAGNOSIS — R112 Nausea with vomiting, unspecified: Secondary | ICD-10-CM | POA: Diagnosis not present

## 2019-02-16 DIAGNOSIS — R591 Generalized enlarged lymph nodes: Secondary | ICD-10-CM

## 2019-02-16 DIAGNOSIS — R599 Enlarged lymph nodes, unspecified: Secondary | ICD-10-CM

## 2019-02-16 DIAGNOSIS — D801 Nonfamilial hypogammaglobulinemia: Secondary | ICD-10-CM | POA: Diagnosis not present

## 2019-02-16 DIAGNOSIS — R891 Abnormal level of hormones in specimens from other organs, systems and tissues: Secondary | ICD-10-CM | POA: Insufficient documentation

## 2019-02-16 DIAGNOSIS — R5383 Other fatigue: Secondary | ICD-10-CM | POA: Diagnosis not present

## 2019-02-16 DIAGNOSIS — Z8614 Personal history of Methicillin resistant Staphylococcus aureus infection: Secondary | ICD-10-CM | POA: Insufficient documentation

## 2019-02-16 DIAGNOSIS — B078 Other viral warts: Secondary | ICD-10-CM | POA: Diagnosis not present

## 2019-02-16 LAB — CBC WITH DIFFERENTIAL (CANCER CENTER ONLY)
Abs Immature Granulocytes: 0.02 10*3/uL (ref 0.00–0.07)
Basophils Absolute: 0 10*3/uL (ref 0.0–0.1)
Basophils Relative: 0 %
Eosinophils Absolute: 0.1 10*3/uL (ref 0.0–0.5)
Eosinophils Relative: 1 %
HCT: 44.3 % (ref 36.0–46.0)
Hemoglobin: 15.3 g/dL — ABNORMAL HIGH (ref 12.0–15.0)
Immature Granulocytes: 0 %
Lymphocytes Relative: 23 %
Lymphs Abs: 2.1 10*3/uL (ref 0.7–4.0)
MCH: 29.4 pg (ref 26.0–34.0)
MCHC: 34.5 g/dL (ref 30.0–36.0)
MCV: 85 fL (ref 80.0–100.0)
Monocytes Absolute: 0.7 10*3/uL (ref 0.1–1.0)
Monocytes Relative: 7 %
Neutro Abs: 6.1 10*3/uL (ref 1.7–7.7)
Neutrophils Relative %: 69 %
Platelet Count: 184 10*3/uL (ref 150–400)
RBC: 5.21 MIL/uL — ABNORMAL HIGH (ref 3.87–5.11)
RDW: 12.2 % (ref 11.5–15.5)
WBC Count: 9 10*3/uL (ref 4.0–10.5)
nRBC: 0 % (ref 0.0–0.2)

## 2019-02-16 LAB — CMP (CANCER CENTER ONLY)
ALT: 25 U/L (ref 0–44)
AST: 20 U/L (ref 15–41)
Albumin: 4.6 g/dL (ref 3.5–5.0)
Alkaline Phosphatase: 48 U/L (ref 38–126)
Anion gap: 8 (ref 5–15)
BUN: 13 mg/dL (ref 6–20)
CO2: 25 mmol/L (ref 22–32)
Calcium: 9.4 mg/dL (ref 8.9–10.3)
Chloride: 103 mmol/L (ref 98–111)
Creatinine: 0.81 mg/dL (ref 0.44–1.00)
GFR, Est AFR Am: >60 mL/min (ref >60)
GFR, Estimated: >60 mL/min (ref >60)
Glucose, Bld: 88 mg/dL (ref 70–99)
Potassium: 4.4 mmol/L (ref 3.5–5.1)
Sodium: 136 mmol/L (ref 135–145)
Total Bilirubin: 0.6 mg/dL (ref 0.3–1.2)
Total Protein: 7 g/dL (ref 6.5–8.1)

## 2019-02-16 LAB — LACTATE DEHYDROGENASE: LDH: 181 U/L (ref 98–192)

## 2019-02-16 LAB — SAVE SMEAR(SSMR), FOR PROVIDER SLIDE REVIEW

## 2019-02-16 NOTE — Telephone Encounter (Signed)
Return for imaging.   Check out comments: Needs CT scans in the next week or so please. Thank you! Will schedule follow-up once we have results   Per 6/2 los

## 2019-02-16 NOTE — Progress Notes (Signed)
Hematology/Oncology Consultation   Name: Karen Huffman      MRN: [id number]    Location: Room/bed info not found  Date: 02/16/2019 Time:12:38 PM   REFERRING PHYSICIAN: Sanda Lingerthomas Jones, MD  REASON FOR CONSULT: Low gammaglobulinemia and adenopathy   DIAGNOSIS: Pending results of lab and scans.   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 She states that she had noted bilateral axillary adenopathy on several occasions. This seems to come and go and is tender.  No lymphadenopathy noted on exam today.   She has noted increased fatigue, abdominal bloating and n/v at night.  No hot flashes or night sweats.  She states that she had thrombocytopenia as a child.  Hgb is stable at 15.3, MCV 85, WBC count 9.0 and platelets 184.  HIV testing was negative.  She states that bother her father and paternal grandfather have history of leukemia.  She history of eczema and uses Lidex cream  She has had MRSA infections of the skin in the past. She recently had cryotherapy and is receiving Gardisil 9 immunization for HPV warts on the lower portion of both her legs.  She is on birth control which has helped with acne as well as keeping her cycle regular and light.  No fever, chills, n/v, cough, SOB, chest pain, palpitations, abdominal pain or changes in bowel or bladder habits.  No swelling, tenderness, numbness or tingling in her extremities.  She does not smoke, drink alcoholic beverages or use recreational drugs.  She generally has a good appetite but states it has been down a bit this past week. She is staying well hydrated. Her weight is stable.   ROS: All other 10 point review of systems is negative.   PAST MEDICAL HISTORY:   Past Medical History:  Diagnosis Date  . Asthma     ALLERGIES: No Known Allergies    MEDICATIONS:  Current Outpatient Medications on File Prior to Visit  Medication  Sig Dispense Refill  . CRYSELLE-28 0.3-30 MG-MCG tablet Take 1 tablet by mouth daily.    . fluocinonide cream (LIDEX) 0.05 % Apply 1 application topically 2 (two) times daily. 120 g 2  . levocetirizine (XYZAL) 5 MG tablet Take 1 tablet (5 mg total) by mouth every evening. 90 tablet 1   No current facility-administered medications on file prior to visit.      PAST SURGICAL HISTORY Past Surgical History:  Procedure Laterality Date  . TONSILLECTOMY      FAMILY HISTORY: Family History  Problem Relation Age of Onset  . Narcolepsy Mother     SOCIAL HISTORY:  reports that she has never smoked. She has never used smokeless tobacco. She reports that she does not drink alcohol or use drugs.  PERFORMANCE STATUS: The patient's performance status is 1 - Symptomatic but completely ambulatory  PHYSICAL EXAM: Most Recent Vital Signs: Blood pressure 103/79, pulse 88, temperature 98.1 F (36.7 C), temperature source Oral, weight 139 lb 6.4 oz (63.2 kg), SpO2 100 %. BP 103/79 (BP Location: Right Arm, Patient Position: Sitting)   Pulse 88   Temp 98.1 F (36.7 C) (Oral)   Wt 139 lb 6.4 oz (63.2 kg)   SpO2 100%   BMI 21.35 kg/m   General Appearance:    Alert, cooperative, no distress, appears stated age  Head:    Normocephalic, without obvious abnormality, atraumatic  Eyes:    PERRL, conjunctiva/corneas clear, EOM's intact, fundi  benign, both eyes        Throat:   Lips, mucosa, and tongue normal; teeth and gums normal  Neck:   Supple, symmetrical, trachea midline, no adenopathy;    thyroid:  no enlargement/tenderness/nodules; no carotid   bruit or JVD  Back:     Symmetric, no curvature, ROM normal, no CVA tenderness  Lungs:     Clear to auscultation bilaterally, respirations unlabored  Chest Wall:    No tenderness or deformity   Heart:    Regular rate and rhythm, S1 and S2 normal, no murmur, rub   or gallop     Abdomen:     Soft, non-tender, bowel sounds active all four quadrants,     no masses, no organomegaly        Extremities:   Extremities normal, atraumatic, no cyanosis or edema  Pulses:   2+ and symmetric all extremities  Skin:   Skin color, texture, turgor normal, no rashes or lesions  Lymph nodes:   Cervical, supraclavicular, and axillary nodes normal  Neurologic:   CNII-XII intact, normal strength, sensation and reflexes    throughout    LABORATORY DATA:  Results for orders placed or performed in visit on 02/16/19 (from the past 48 hour(s))  CBC with Differential (Cancer Center Only)     Status: Abnormal   Collection Time: 02/16/19 11:55 AM  Result Value Ref Range   WBC Count 9.0 4.0 - 10.5 K/uL   RBC 5.21 (H) 3.87 - 5.11 MIL/uL   Hemoglobin 15.3 (H) 12.0 - 15.0 g/dL   HCT 69.6 29.5 - 28.4 %   MCV 85.0 80.0 - 100.0 fL   MCH 29.4 26.0 - 34.0 pg   MCHC 34.5 30.0 - 36.0 g/dL   RDW 13.2 44.0 - 10.2 %   Platelet Count 184 150 - 400 K/uL   nRBC 0.0 0.0 - 0.2 %   Neutrophils Relative % 69 %   Neutro Abs 6.1 1.7 - 7.7 K/uL   Lymphocytes Relative 23 %   Lymphs Abs 2.1 0.7 - 4.0 K/uL   Monocytes Relative 7 %   Monocytes Absolute 0.7 0.1 - 1.0 K/uL   Eosinophils Relative 1 %   Eosinophils Absolute 0.1 0.0 - 0.5 K/uL   Basophils Relative 0 %   Basophils Absolute 0.0 0.0 - 0.1 K/uL   Immature Granulocytes 0 %   Abs Immature Granulocytes 0.02 0.00 - 0.07 K/uL   Abnormal Lymphocytes Present PRESENT     Comment: Performed at [facility] at [facility], [address], [place], [place] [address]  CMP (Cancer Center only)     Status: None   Collection Time: 02/16/19 11:55 AM  Result Value Ref Range   Sodium 136 135 - 145 mmol/L   Potassium 4.4 3.5 - 5.1 mmol/L   Chloride 103 98 - 111 mmol/L   CO2 25 22 - 32 mmol/L   Glucose, Bld 88 70 - 99 mg/dL   BUN 13 6 - 20 mg/dL   Creatinine 6.44 0.34 - 1.00 mg/dL   Calcium 9.4 8.9 - 74.2 mg/dL   Total Protein 7.0 6.5 - 8.1 g/dL   Albumin 4.6 3.5 - 5.0 g/dL   AST 20 15 - 41 U/L    ALT 25 0 - 44 U/L   Alkaline Phosphatase 48 38 - 126 U/L   Total Bilirubin 0.6 0.3 - 1.2 mg/dL   GFR, Est Non Af Am >59 >60 mL/min   GFR, Est AFR  Am >60 >60 mL/min   Anion gap 8 5 - 15    Comment: Performed at [facility] at [facility], [address] [place], [place] [address]  Save [facility])     Status: None   Collection Time: 02/16/19 11:55 AM  Result Value Ref Range   Smear Review SMEAR STAINED AND AVAILABLE FOR REVIEW     Comment: Performed at [facility] at [facility], [address] [place], [place] [address]      RADIOGRAPHY: No results found.     PATHOLOGY: None   ASSESSMENT/PLAN: Karen Huffman is a very pleasant 23 yo caucasian female with low gammaglobulinemia and adenopathy. She has noted increased fatigue, abdominal bloating and n/v at night.  Exam today was negative for palpable adenopathy. We will get CT scans to further evaluate for adenopathy.  Lab work pending. We will contact her once we have her results in hand and will schedule her follow-up at that time.   All questions were answered and she is in agreement with the plan. She will contact our office with any questions or concerns. We can certainly see her sooner if needed.   She was discussed with and also seen by Dr. Myna Hidalgo and he is in agreement with the aforementioned.   Emeline Gins, NP     Addendum: I saw and examined Karen Huffman with Maralyn Sago.  I agree with the above assessment.  Karen Huffman is incredibly nice.  She is quite chatty.  It was nice talking to her.  I cannot find any adenopathy on her exam.  There is no splenomegaly.  Her labs look okay.  She is not anemic.  There is no issues with her gammaglobulins.  Her IgG level is 695 mg/dL.  She has a normal beta-2 microglobulin at 1.1.  Her LDH is 181.  I am not sure at all as to what this adenopathy is all about that she had.  Again we cannot find anything on her exam today.  I  do not think she has anything that would be considered a collagen vascular disease.  Also anything that would suggest any type of infectious etiology.  There is been no traveling.  She is obviously out from school because of the coronavirus.  I think the only thing we could do is get a CT scan on her.  This will show [place] if there is any adenopathy.  Hopefully, we will not find anything on her CT scan.  If there is a problem on her CT scan, then we will have to figure out what the best way is of assessing it.  We spent about 45 minutes with Karen Huffman.  We went over her lab work.  We really assured her that we just did not think that she was going to have a problem.  I did try to warn her about going to the tanning bed all the time.  She like to go to the tanning bed.  I warned her about the risk of melanoma and that this is a fast-growing cancer and numbers, particularly in young women.  Christin Bach, MD

## 2019-02-17 LAB — BETA 2 MICROGLOBULIN, SERUM: Beta-2 Microglobulin: 1.1 mg/L (ref 0.6–2.4)

## 2019-02-17 LAB — IGG, IGA, IGM
IgA: 64 mg/dL — ABNORMAL LOW (ref 87–352)
IgG (Immunoglobin G), Serum: 695 mg/dL (ref 586–1602)
IgM (Immunoglobulin M), Srm: 82 mg/dL (ref 26–217)

## 2019-02-18 ENCOUNTER — Other Ambulatory Visit: Payer: BLUE CROSS/BLUE SHIELD

## 2019-02-18 ENCOUNTER — Inpatient Hospital Stay: Payer: BLUE CROSS/BLUE SHIELD | Admitting: Family

## 2019-03-04 ENCOUNTER — Encounter (HOSPITAL_BASED_OUTPATIENT_CLINIC_OR_DEPARTMENT_OTHER): Payer: Self-pay

## 2019-03-04 ENCOUNTER — Ambulatory Visit (HOSPITAL_BASED_OUTPATIENT_CLINIC_OR_DEPARTMENT_OTHER)
Admission: RE | Admit: 2019-03-04 | Discharge: 2019-03-04 | Disposition: A | Discharge status: Home / Self Care | Payer: BLUE CROSS/BLUE SHIELD | Source: Ambulatory Visit | Attending: Family | Admitting: Family

## 2019-03-04 ENCOUNTER — Other Ambulatory Visit: Payer: Self-pay

## 2019-03-04 DIAGNOSIS — R591 Generalized enlarged lymph nodes: Secondary | ICD-10-CM | POA: Diagnosis not present

## 2019-03-04 DIAGNOSIS — R599 Enlarged lymph nodes, unspecified: Secondary | ICD-10-CM

## 2019-03-04 DIAGNOSIS — D801 Nonfamilial hypogammaglobulinemia: Secondary | ICD-10-CM | POA: Diagnosis not present

## 2019-03-04 MED ORDER — IOHEXOL 300 MG/ML  SOLN
100.0000 mL | Freq: Once | INTRAMUSCULAR | Status: AC | PRN
  Administered 2019-03-04: 100 mL via INTRAVENOUS

## 2019-03-05 ENCOUNTER — Encounter: Payer: Self-pay | Admitting: *Deleted

## 2019-03-05 NOTE — Progress Notes (Signed)
Called patient to notify her of negative workup including scans and blood work. Since this office didn't identify and hematological/oncological concern, patient will be released back to the care of her PCP.  Patient appreciative of the information and will follow up with PCP.

## 2019-03-17 DIAGNOSIS — L738 Other specified follicular disorders: Secondary | ICD-10-CM | POA: Diagnosis not present

## 2019-03-17 DIAGNOSIS — L2089 Other atopic dermatitis: Secondary | ICD-10-CM | POA: Diagnosis not present

## 2019-03-17 DIAGNOSIS — B078 Other viral warts: Secondary | ICD-10-CM | POA: Diagnosis not present

## 2019-03-26 DIAGNOSIS — Z1331 Encounter for screening for depression: Secondary | ICD-10-CM | POA: Diagnosis not present

## 2019-03-26 DIAGNOSIS — Z1389 Encounter for screening for other disorder: Secondary | ICD-10-CM | POA: Diagnosis not present

## 2019-03-26 DIAGNOSIS — Z0000 Encounter for general adult medical examination without abnormal findings: Secondary | ICD-10-CM | POA: Diagnosis not present

## 2019-03-30 DIAGNOSIS — Z124 Encounter for screening for malignant neoplasm of cervix: Secondary | ICD-10-CM | POA: Diagnosis not present

## 2019-03-30 DIAGNOSIS — R87610 Atypical squamous cells of undetermined significance on cytologic smear of cervix (ASC-US): Secondary | ICD-10-CM | POA: Diagnosis not present

## 2019-03-30 DIAGNOSIS — Z01419 Encounter for gynecological examination (general) (routine) without abnormal findings: Secondary | ICD-10-CM | POA: Diagnosis not present

## 2019-03-30 DIAGNOSIS — Z1151 Encounter for screening for human papillomavirus (HPV): Secondary | ICD-10-CM | POA: Diagnosis not present

## 2019-04-05 DIAGNOSIS — H5213 Myopia, bilateral: Secondary | ICD-10-CM | POA: Diagnosis not present

## 2019-04-26 ENCOUNTER — Other Ambulatory Visit: Payer: Self-pay | Admitting: Internal Medicine

## 2019-05-13 DIAGNOSIS — L739 Follicular disorder, unspecified: Secondary | ICD-10-CM | POA: Diagnosis not present

## 2019-05-13 DIAGNOSIS — L309 Dermatitis, unspecified: Secondary | ICD-10-CM | POA: Diagnosis not present

## 2019-06-09 DIAGNOSIS — J029 Acute pharyngitis, unspecified: Secondary | ICD-10-CM | POA: Diagnosis not present

## 2019-12-17 IMAGING — CT CT CHEST WITH CONTRAST
2 of 4 series · 13 of 36 positions shown, 16 images · IV contrast (APPLIED)
Comparison: None.

CLINICAL DATA: Adenopathy. Low gamma globulin level. Assessed lymph
nodes in the axilla.

EXAM:
CT CHEST, ABDOMEN, AND PELVIS WITH CONTRAST
TECHNIQUE: Multidetector CT imaging of the chest, abdomen and pelvis was
performed following the standard protocol during bolus
administration of intravenous contrast.
CONTRAST:  100mL OMNIPAQUE IOHEXOL 300 MG/ML  SOLN

[Series 2: cap with 2 · axial · 0.83mm/px · z∈[-616,-81]mm · 10 of 131 slices shown, 13 images]
[im 12/131  mediastinal]
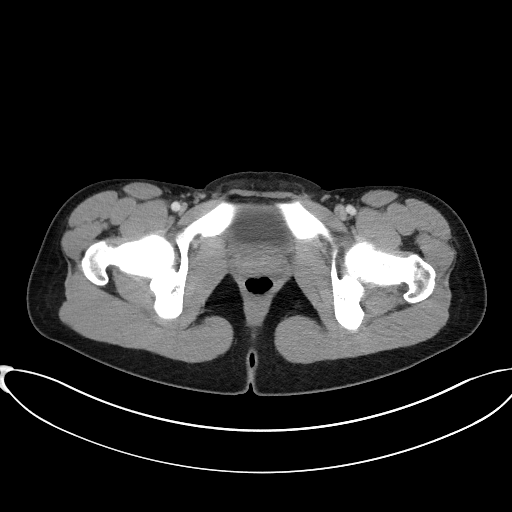
[im 12/131  lung]
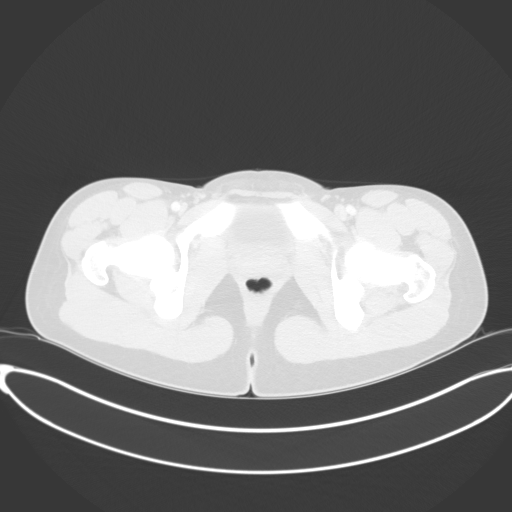
[im 24/131  lung]
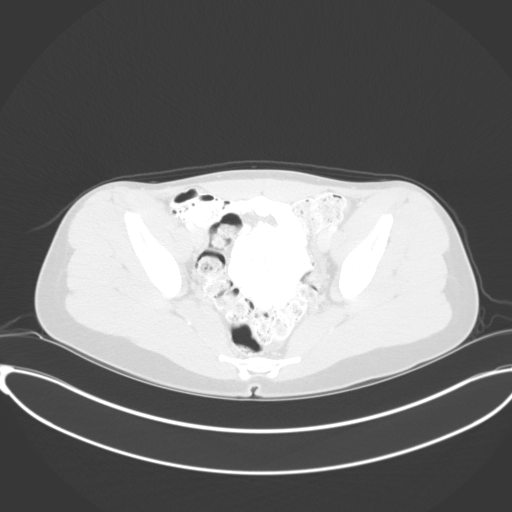
[im 36/131  lung]
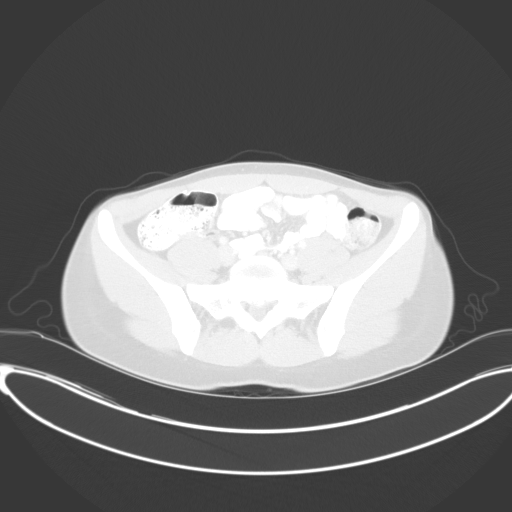
[im 48/131  lung]
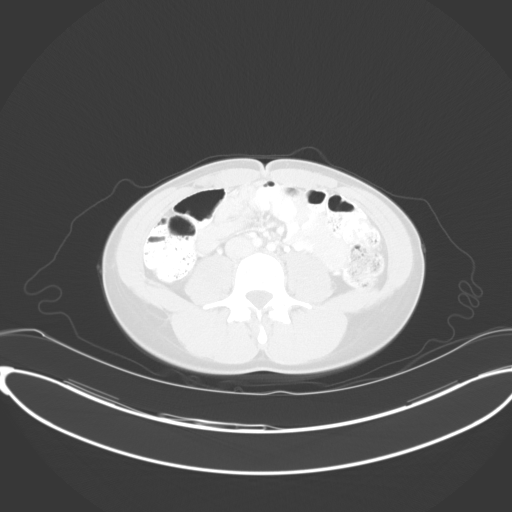
[im 60/131  mediastinal]
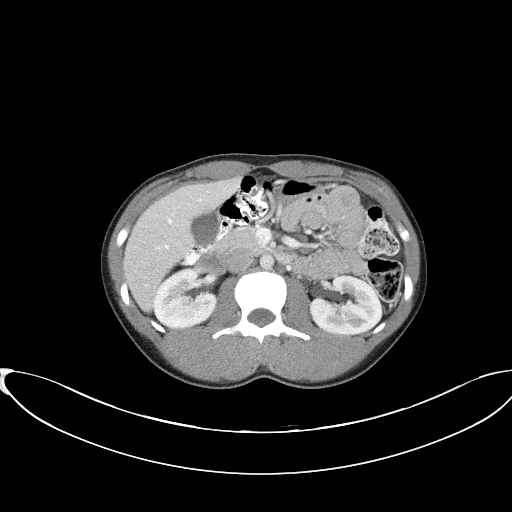
[im 60/131  lung]
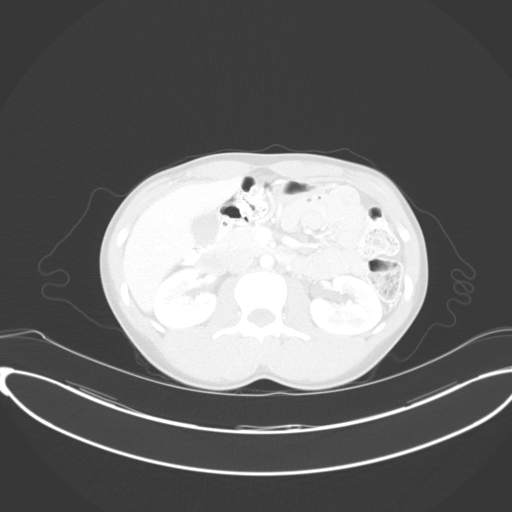
[im 71/131  lung]
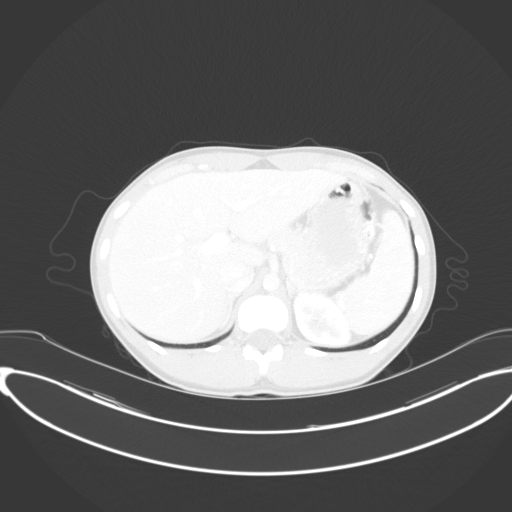
[im 83/131  lung]
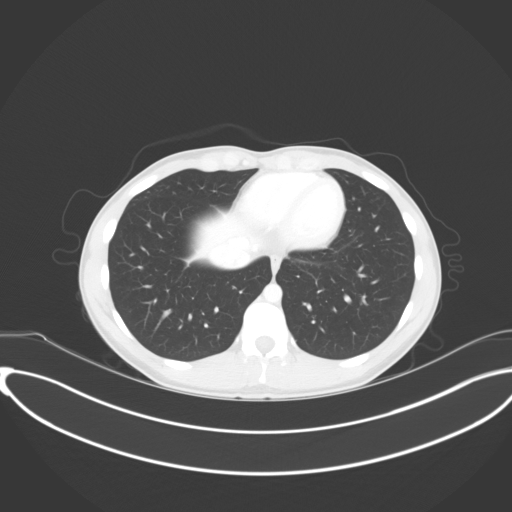
[im 95/131  lung]
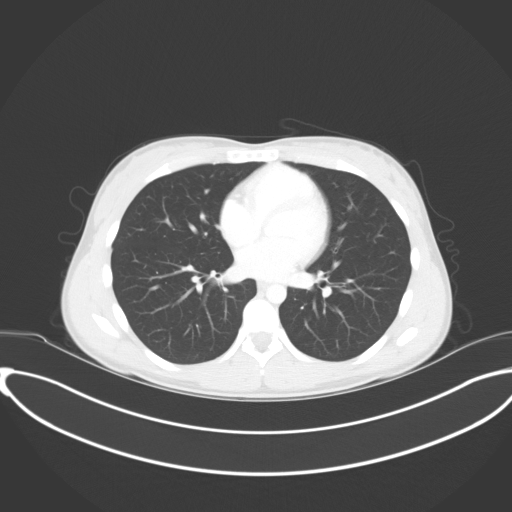
[im 107/131  mediastinal]
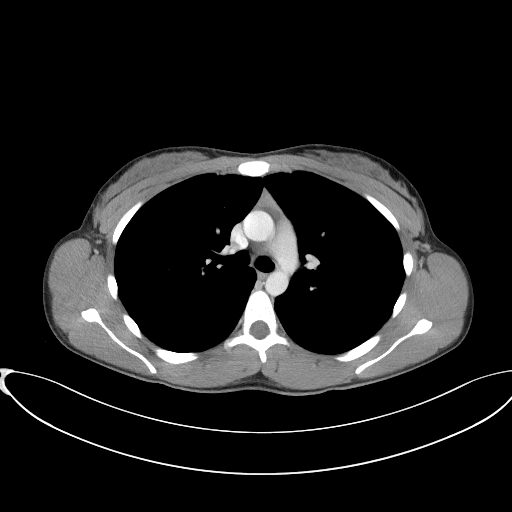
[im 107/131  lung]
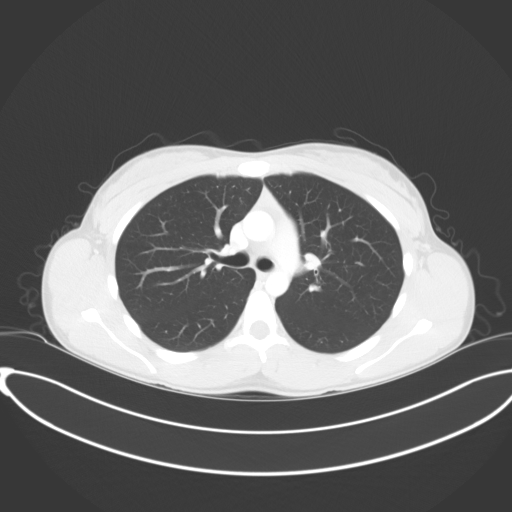
[im 119/131  lung]
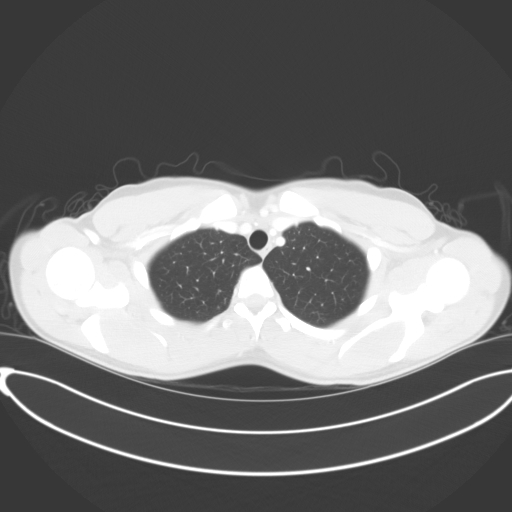

[Series 5: coronals · coronal · 0.85mm/px · 3 of 108 slices shown]
[im 22/108  lung]
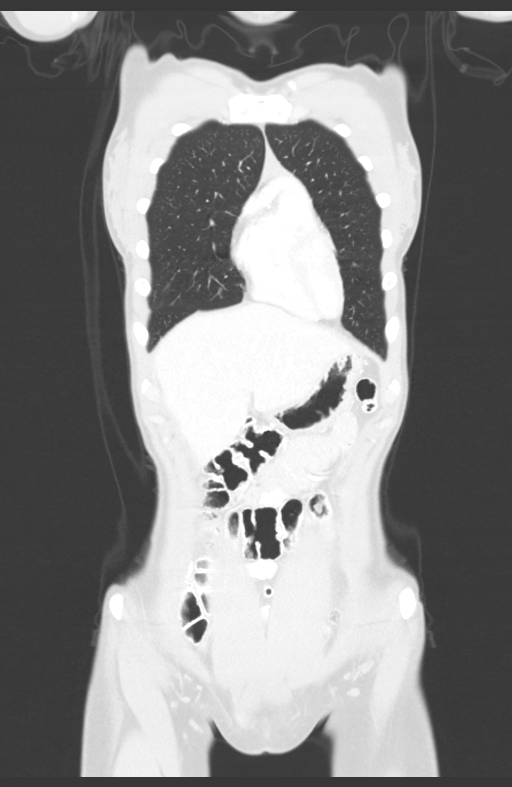
[im 43/108  lung]
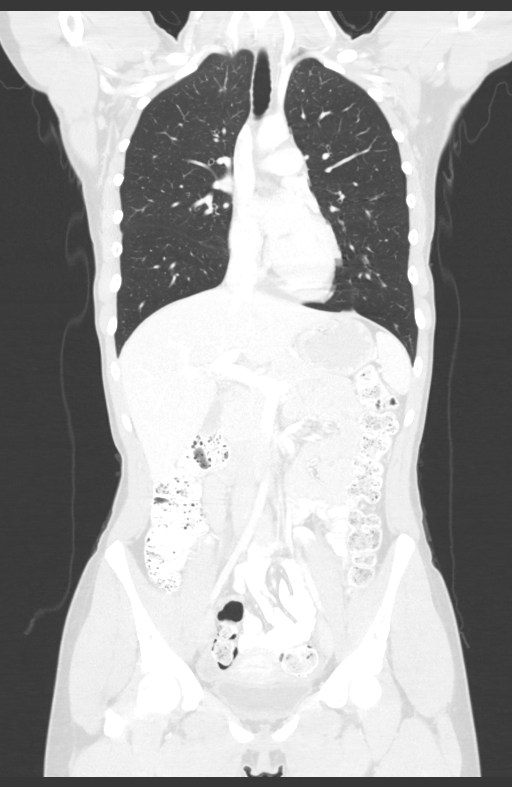
[im 65/108  lung]
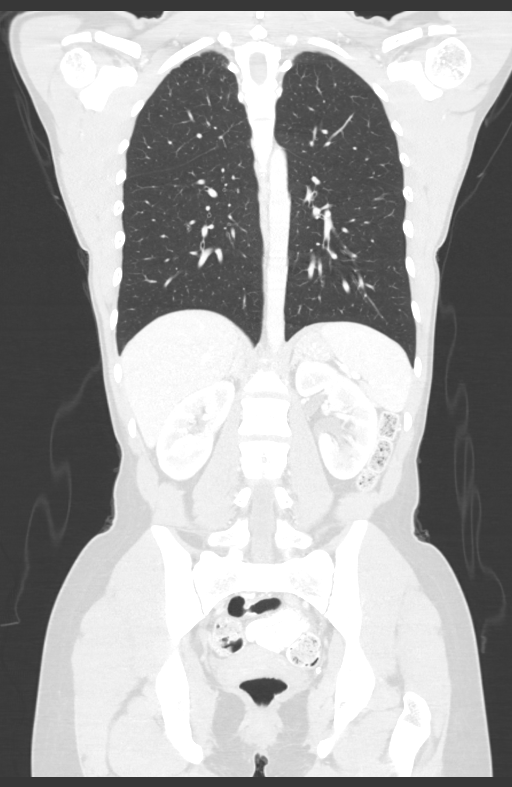

[13 of 36 positions shown; findings below may reference images not displayed]

FINDINGS: CT CHEST FINDINGS

Cardiovascular: Normal heart size. No pericardial effusion
identified.

Mediastinum/Nodes: No enlarged mediastinal, hilar, or axillary lymph
nodes. Thyroid gland, trachea, and esophagus demonstrate no
significant findings.

Lungs/Pleura: Small perifissural nodule in the left midlung measures
4 mm, image 73/4. Favor benign perifissural lymph node. No
suspicious pulmonary nodules or masses identified.

Musculoskeletal: No aggressive lytic or sclerotic bone lesions
identified.

CT ABDOMEN PELVIS FINDINGS

Hepatobiliary: No focal liver abnormality is seen. No gallstones,
gallbladder wall thickening, or biliary dilatation.

Pancreas: Unremarkable. No pancreatic ductal dilatation or
surrounding inflammatory changes.

Spleen: Normal in size without focal abnormality.

Adrenals/Urinary Tract: Normal appearance of the adrenal glands. The
kidneys are unremarkable. No mass or hydronephrosis. Urinary bladder
appears normal.

Stomach/Bowel: Stomach is within normal limits. Appendix appears
normal. No evidence of bowel wall thickening, distention, or
inflammatory changes.

Vascular/Lymphatic: No significant vascular findings are present. No
enlarged abdominal or pelvic lymph nodes.

Reproductive: Uterus and bilateral adnexa are unremarkable.

Other: No free fluid or fluid collection.

Musculoskeletal: No acute or significant osseous findings.
IMPRESSION: 1. Normal exam. No mass or adenopathy identified within the chest,
abdomen, or pelvis.
2. Normal appearance of the spleen.  No evidence for splenomegaly.

## 2019-12-18 DIAGNOSIS — Z20822 Contact with and (suspected) exposure to covid-19: Secondary | ICD-10-CM | POA: Diagnosis not present

## 2020-02-25 DIAGNOSIS — F331 Major depressive disorder, recurrent, moderate: Secondary | ICD-10-CM | POA: Diagnosis not present

## 2020-02-25 DIAGNOSIS — E041 Nontoxic single thyroid nodule: Secondary | ICD-10-CM | POA: Diagnosis not present

## 2020-02-25 DIAGNOSIS — F411 Generalized anxiety disorder: Secondary | ICD-10-CM | POA: Diagnosis not present

## 2020-02-29 ENCOUNTER — Encounter: Payer: Self-pay | Admitting: Internal Medicine

## 2020-02-29 ENCOUNTER — Ambulatory Visit (INDEPENDENT_AMBULATORY_CARE_PROVIDER_SITE_OTHER): Payer: BC Managed Care – PPO | Admitting: Internal Medicine

## 2020-02-29 ENCOUNTER — Other Ambulatory Visit: Payer: Self-pay

## 2020-02-29 VITALS — BP 122/80 | HR 90 | Temp 98.4°F | Resp 16 | Ht 68.0 in | Wt 130.0 lb

## 2020-02-29 DIAGNOSIS — F418 Other specified anxiety disorders: Secondary | ICD-10-CM | POA: Diagnosis not present

## 2020-02-29 DIAGNOSIS — D696 Thrombocytopenia, unspecified: Secondary | ICD-10-CM | POA: Diagnosis not present

## 2020-02-29 NOTE — Patient Instructions (Signed)

## 2020-02-29 NOTE — Progress Notes (Signed)
Subjective:  Patient ID: Karen Huffman, female    DOB: 11/25/1995  Age: 24 y.o. MRN: [id number]  CC: Depression  This visit occurred during the SARS-CoV-2 public health emergency.  Safety protocols were in place, including screening questions prior to the visit, additional usage of staff PPE, and extensive cleaning of exam room while observing appropriate contact time as indicated for disinfecting solutions.    HPI Karen Huffman presents for f/up - She saw a psychiatrist about a week ago for the evaluation and treatment of anxiety and depression.  Sertraline was prescribed.  She has taken a few doses but she says it made her insomnia worse so she does not think she is going to continue taking it.  The psychiatrist thought that she felt a thyroid nodule.  She complains of insomnia, anxiety, panic attacks, intermittent dizziness, and rare diarrhea.  Outpatient Medications Prior to Visit  Medication Sig Dispense Refill  . CRYSELLE-28 0.3-30 MG-MCG tablet Take 1 tablet by mouth daily.    . fluocinonide cream (LIDEX) 2.77 % Apply 1 application topically 2 (two) times daily. 120 g 2  . levocetirizine (XYZAL) 5 MG tablet Take 1 tablet (5 mg total) by mouth every evening. 90 tablet 1   No facility-administered medications prior to visit.    ROS Review of Systems  Constitutional: Negative for appetite change, chills, diaphoresis, fatigue and unexpected weight change.  HENT: Negative.   Eyes: Negative.   Respiratory: Negative for cough, chest tightness, shortness of breath and wheezing.   Cardiovascular: Negative for chest pain, palpitations and leg swelling.  Gastrointestinal: Positive for diarrhea. Negative for abdominal pain, constipation, nausea and vomiting.  Endocrine: Negative for cold intolerance and heat intolerance.  Genitourinary: Negative.  Negative for vaginal bleeding.  Musculoskeletal: Negative.  Negative for arthralgias and myalgias.  Skin: Negative.  Negative for  color change and pallor.  Neurological: Positive for dizziness. Negative for weakness, light-headedness and headaches.  Hematological: Negative for adenopathy. Does not bruise/bleed easily.  Psychiatric/Behavioral: Positive for dysphoric mood and sleep disturbance. Negative for agitation, confusion, decreased concentration, self-injury and suicidal ideas. The patient is nervous/anxious. The patient is not hyperactive.     Objective:  BP 122/80   Pulse 90   Temp 98.4 F (36.9 C) (Oral)   Resp 16   Ht 5\' 8"  (1.727 m)   Wt 130 lb (59 kg)   LMP 02/16/2020   SpO2 98%   BMI 19.77 kg/m   BP Readings from Last 3 Encounters:  02/29/20 122/80  02/16/19 103/79  01/19/19 120/80    Wt Readings from Last 3 Encounters:  02/29/20 130 lb (59 kg)  02/16/19 139 lb 6.4 oz (63.2 kg)  01/19/19 135 lb (61.2 kg)    Physical Exam Vitals reviewed.  Constitutional:      Appearance: Normal appearance.  HENT:     Nose: Nose normal.     Mouth/Throat:     Mouth: Mucous membranes are moist.  Eyes:     General: No scleral icterus.    Conjunctiva/sclera: Conjunctivae normal.  Neck:     Thyroid: No thyroid mass, thyromegaly or thyroid tenderness.  Cardiovascular:     Rate and Rhythm: Normal rate and regular rhythm.     Pulses: Normal pulses.     Heart sounds: No murmur heard.   Pulmonary:     Effort: Pulmonary effort is normal.     Breath sounds: No stridor. No wheezing, rhonchi or rales.  Abdominal:     General: Abdomen is  flat.     Palpations: There is no mass.     Tenderness: There is no abdominal tenderness. There is no guarding.  Musculoskeletal:        General: Normal range of motion.     Cervical back: Neck supple.     Right lower leg: No edema.     Left lower leg: No edema.  Lymphadenopathy:     Cervical: No cervical adenopathy.  Skin:    General: Skin is warm and dry.     Coloration: Skin is not pale.  Neurological:     General: No focal deficit present.     Mental Status:  She is alert and oriented to person, place, and time. Mental status is at baseline.  Psychiatric:        Mood and Affect: Mood normal.        Behavior: Behavior normal.        Thought Content: Thought content normal.        Judgment: Judgment normal.     Lab Results  Component Value Date   WBC 8.5 02/29/2020   HGB 14.5 02/29/2020   HCT 42.0 02/29/2020   PLT 139.0 (L) 02/29/2020   GLUCOSE 92 02/29/2020   ALT 25 02/16/2019   AST 20 02/16/2019   NA 138 02/29/2020   K 3.7 02/29/2020   CL 104 02/29/2020   CREATININE 0.79 02/29/2020   BUN 14 02/29/2020   CO2 26 02/29/2020   TSH 1.36 02/29/2020    CT Chest W Contrast  Result Date: 03/04/2019 CLINICAL DATA:  Adenopathy. Low gamma globulin level. Assessed lymph nodes in the axilla. EXAM: CT CHEST, ABDOMEN, AND PELVIS WITH CONTRAST TECHNIQUE: Multidetector CT imaging of the chest, abdomen and pelvis was performed following the standard protocol during bolus administration of intravenous contrast. CONTRAST:  OMNIPAQUE IOHEXOL 300 MG/ML  SOLN COMPARISON:  None. FINDINGS: CT CHEST FINDINGS Cardiovascular: Normal heart size. No pericardial effusion identified. Mediastinum/Nodes: No enlarged mediastinal, hilar, or axillary lymph nodes. Thyroid gland, trachea, and esophagus demonstrate no significant findings. Lungs/Pleura: Small perifissural nodule in the left midlung measures 4 mm, image 73/4. Favor benign perifissural lymph node. No suspicious pulmonary nodules or masses identified. Musculoskeletal: No aggressive lytic or sclerotic bone lesions identified. CT ABDOMEN PELVIS FINDINGS Hepatobiliary: No focal liver abnormality is seen. No gallstones, gallbladder wall thickening, or biliary dilatation. Pancreas: Unremarkable. No pancreatic ductal dilatation or surrounding inflammatory changes. Spleen: Normal in size without focal abnormality. Adrenals/Urinary Tract: Normal appearance of the adrenal glands. The kidneys are unremarkable. No mass or  hydronephrosis. Urinary bladder appears normal. Stomach/Bowel: Stomach is within normal limits. Appendix appears normal. No evidence of bowel wall thickening, distention, or inflammatory changes. Vascular/Lymphatic: No significant vascular findings are present. No enlarged abdominal or pelvic lymph nodes. Reproductive: Uterus and bilateral adnexa are unremarkable. Other: No free fluid or fluid collection. Musculoskeletal: No acute or significant osseous findings. IMPRESSION: 1. Normal exam. No mass or adenopathy identified within the chest, abdomen, or pelvis. 2. Normal appearance of the spleen.  No evidence for splenomegaly. Electronically Signed   By: Signa Kell M.D.   On: 03/04/2019 12:06   CT ABDOMEN PELVIS W CONTRAST  Result Date: 03/04/2019 CLINICAL DATA:  Adenopathy. Low gamma globulin level. Assessed lymph nodes in the axilla. EXAM: CT CHEST, ABDOMEN, AND PELVIS WITH CONTRAST TECHNIQUE: Multidetector CT imaging of the chest, abdomen and pelvis was performed following the standard protocol during bolus administration of intravenous contrast. CONTRAST:  OMNIPAQUE IOHEXOL 300 MG/ML  SOLN  COMPARISON:  None. FINDINGS: CT CHEST FINDINGS Cardiovascular: Normal heart size. No pericardial effusion identified. Mediastinum/Nodes: No enlarged mediastinal, hilar, or axillary lymph nodes. Thyroid gland, trachea, and esophagus demonstrate no significant findings. Lungs/Pleura: Small perifissural nodule in the left midlung measures 4 mm, image 73/4. Favor benign perifissural lymph node. No suspicious pulmonary nodules or masses identified. Musculoskeletal: No aggressive lytic or sclerotic bone lesions identified. CT ABDOMEN PELVIS FINDINGS Hepatobiliary: No focal liver abnormality is seen. No gallstones, gallbladder wall thickening, or biliary dilatation. Pancreas: Unremarkable. No pancreatic ductal dilatation or surrounding inflammatory changes. Spleen: Normal in size without focal abnormality.  Adrenals/Urinary Tract: Normal appearance of the adrenal glands. The kidneys are unremarkable. No mass or hydronephrosis. Urinary bladder appears normal. Stomach/Bowel: Stomach is within normal limits. Appendix appears normal. No evidence of bowel wall thickening, distention, or inflammatory changes. Vascular/Lymphatic: No significant vascular findings are present. No enlarged abdominal or pelvic lymph nodes. Reproductive: Uterus and bilateral adnexa are unremarkable. Other: No free fluid or fluid collection. Musculoskeletal: No acute or significant osseous findings. IMPRESSION: 1. Normal exam. No mass or adenopathy identified within the chest, abdomen, or pelvis. 2. Normal appearance of the spleen.  No evidence for splenomegaly. Electronically Signed   By: Signa Kell M.D.   On: 03/04/2019 12:06    Assessment & Plan:   Jaydeen was seen today for depression.  Diagnoses and all orders for this visit:  Anxiety associated with depression- I do not feel a nodule.  Her TSH is normal.  The other labs are reassuring there there is nothing metabolic causing the anxiety and depression. -     Basic metabolic panel; Future -     CBC with Differential/Platelet; Future -     TSH; Future -     TSH -     CBC with Differential/Platelet -     Basic metabolic panel  Thrombocytopenia (HCC)- She has mild thrombocytopenia but no history of bleeding or bruising.  I will screen her for folate and B12 deficiencies. -     CBC with Differential/Platelet; Future -     Folate; Future -     Vitamin B12; Future -     Methylmalonic acid, serum; Future   I have discontinued [facility]'s Cryselle-28, fluocinonide cream, and levocetirizine.  No orders of the defined types were placed in this encounter.    Follow-up: Return if symptoms worsen or fail to improve.  Sanda Linger, MD

## 2020-03-01 DIAGNOSIS — D696 Thrombocytopenia, unspecified: Secondary | ICD-10-CM | POA: Insufficient documentation

## 2020-03-01 LAB — CBC WITH DIFFERENTIAL/PLATELET
Basophils Absolute: 0.2 10*3/uL — ABNORMAL HIGH (ref 0.0–0.1)
Basophils Relative: 1.9 % (ref 0.0–3.0)
Eosinophils Absolute: 0.1 10*3/uL (ref 0.0–0.7)
Eosinophils Relative: 1.5 % (ref 0.0–5.0)
HCT: 42 % (ref 36.0–46.0)
Hemoglobin: 14.5 g/dL (ref 12.0–15.0)
Lymphocytes Relative: 36.5 % (ref 12.0–46.0)
Lymphs Abs: 3.1 10*3/uL (ref 0.7–4.0)
MCHC: 34.6 g/dL (ref 30.0–36.0)
MCV: 87.1 fl (ref 78.0–100.0)
Monocytes Absolute: 0.8 10*3/uL (ref 0.1–1.0)
Monocytes Relative: 9.3 % (ref 3.0–12.0)
Neutro Abs: 4.3 10*3/uL (ref 1.4–7.7)
Neutrophils Relative %: 50.8 % (ref 43.0–77.0)
Platelets: 139 10*3/uL — ABNORMAL LOW (ref 150.0–400.0)
RBC: 4.82 Mil/uL (ref 3.87–5.11)
RDW: 12.1 % (ref 11.5–15.5)
WBC: 8.5 10*3/uL (ref 4.0–10.5)

## 2020-03-01 LAB — BASIC METABOLIC PANEL
BUN: 14 mg/dL (ref 6–23)
CO2: 26 mEq/L (ref 19–32)
Calcium: 9.3 mg/dL (ref 8.4–10.5)
Chloride: 104 mEq/L (ref 96–112)
Creatinine, Ser: 0.79 mg/dL (ref 0.40–1.20)
GFR: 91.03 mL/min (ref >60.00)
Glucose, Bld: 92 mg/dL (ref 70–99)
Potassium: 3.7 mEq/L (ref 3.5–5.1)
Sodium: 138 mEq/L (ref 135–145)

## 2020-03-01 LAB — TSH: TSH: 1.36 u[IU]/mL (ref 0.35–4.50)

## 2020-03-06 ENCOUNTER — Other Ambulatory Visit (INDEPENDENT_AMBULATORY_CARE_PROVIDER_SITE_OTHER): Payer: BC Managed Care – PPO

## 2020-03-06 DIAGNOSIS — D696 Thrombocytopenia, unspecified: Secondary | ICD-10-CM

## 2020-03-06 LAB — CBC WITH DIFFERENTIAL/PLATELET
Basophils Absolute: 0.1 10*3/uL (ref 0.0–0.1)
Basophils Relative: 0.9 % (ref 0.0–3.0)
Eosinophils Absolute: 0.1 10*3/uL (ref 0.0–0.7)
Eosinophils Relative: 0.8 % (ref 0.0–5.0)
HCT: 44.5 % (ref 36.0–46.0)
Hemoglobin: 15.3 g/dL — ABNORMAL HIGH (ref 12.0–15.0)
Lymphocytes Relative: 28.4 % (ref 12.0–46.0)
Lymphs Abs: 2.1 10*3/uL (ref 0.7–4.0)
MCHC: 34.4 g/dL (ref 30.0–36.0)
MCV: 88 fl (ref 78.0–100.0)
Monocytes Absolute: 0.6 10*3/uL (ref 0.1–1.0)
Monocytes Relative: 8.5 % (ref 3.0–12.0)
Neutro Abs: 4.5 10*3/uL (ref 1.4–7.7)
Neutrophils Relative %: 61.4 % (ref 43.0–77.0)
Platelets: 137 10*3/uL — ABNORMAL LOW (ref 150.0–400.0)
RBC: 5.06 Mil/uL (ref 3.87–5.11)
RDW: 12.6 % (ref 11.5–15.5)
WBC: 7.3 10*3/uL (ref 4.0–10.5)

## 2020-03-06 LAB — VITAMIN B12: Vitamin B-12: 556 pg/mL (ref 211–911)

## 2020-03-06 LAB — FOLATE: Folate: 20.5 ng/mL (ref >5.9)

## 2020-03-09 LAB — METHYLMALONIC ACID, SERUM: Methylmalonic Acid, Quant: 80 nmol/L — ABNORMAL LOW (ref 87–318)

## 2020-03-21 DIAGNOSIS — F428 Other obsessive-compulsive disorder: Secondary | ICD-10-CM | POA: Diagnosis not present

## 2020-03-21 DIAGNOSIS — R0982 Postnasal drip: Secondary | ICD-10-CM | POA: Diagnosis not present

## 2020-03-21 DIAGNOSIS — J029 Acute pharyngitis, unspecified: Secondary | ICD-10-CM | POA: Diagnosis not present

## 2020-03-21 DIAGNOSIS — F331 Major depressive disorder, recurrent, moderate: Secondary | ICD-10-CM | POA: Diagnosis not present

## 2020-03-22 ENCOUNTER — Other Ambulatory Visit: Payer: Self-pay | Admitting: Internal Medicine

## 2020-03-22 DIAGNOSIS — E041 Nontoxic single thyroid nodule: Secondary | ICD-10-CM

## 2020-03-30 DIAGNOSIS — B079 Viral wart, unspecified: Secondary | ICD-10-CM | POA: Diagnosis not present

## 2020-03-31 ENCOUNTER — Ambulatory Visit
Admission: RE | Admit: 2020-03-31 | Discharge: 2020-03-31 | Disposition: A | Discharge status: Home / Self Care | Payer: BC Managed Care – PPO | Source: Ambulatory Visit | Attending: Internal Medicine | Admitting: Internal Medicine

## 2020-03-31 DIAGNOSIS — E041 Nontoxic single thyroid nodule: Secondary | ICD-10-CM | POA: Diagnosis not present

## 2020-04-11 DIAGNOSIS — F331 Major depressive disorder, recurrent, moderate: Secondary | ICD-10-CM | POA: Diagnosis not present

## 2020-04-11 DIAGNOSIS — F428 Other obsessive-compulsive disorder: Secondary | ICD-10-CM | POA: Diagnosis not present

## 2020-04-13 DIAGNOSIS — S335XXA Sprain of ligaments of lumbar spine, initial encounter: Secondary | ICD-10-CM | POA: Diagnosis not present

## 2020-04-13 DIAGNOSIS — M545 Low back pain: Secondary | ICD-10-CM | POA: Diagnosis not present

## 2020-04-13 DIAGNOSIS — M62830 Muscle spasm of back: Secondary | ICD-10-CM | POA: Diagnosis not present

## 2020-04-13 DIAGNOSIS — M9903 Segmental and somatic dysfunction of lumbar region: Secondary | ICD-10-CM | POA: Diagnosis not present

## 2020-04-18 DIAGNOSIS — M9903 Segmental and somatic dysfunction of lumbar region: Secondary | ICD-10-CM | POA: Diagnosis not present

## 2020-04-18 DIAGNOSIS — M545 Low back pain: Secondary | ICD-10-CM | POA: Diagnosis not present

## 2020-04-18 DIAGNOSIS — S336XXA Sprain of sacroiliac joint, initial encounter: Secondary | ICD-10-CM | POA: Diagnosis not present

## 2020-04-18 DIAGNOSIS — M62830 Muscle spasm of back: Secondary | ICD-10-CM | POA: Diagnosis not present

## 2020-04-18 DIAGNOSIS — S335XXA Sprain of ligaments of lumbar spine, initial encounter: Secondary | ICD-10-CM | POA: Diagnosis not present

## 2020-04-20 DIAGNOSIS — M9903 Segmental and somatic dysfunction of lumbar region: Secondary | ICD-10-CM | POA: Diagnosis not present

## 2020-04-20 DIAGNOSIS — M545 Low back pain: Secondary | ICD-10-CM | POA: Diagnosis not present

## 2020-04-20 DIAGNOSIS — S336XXA Sprain of sacroiliac joint, initial encounter: Secondary | ICD-10-CM | POA: Diagnosis not present

## 2020-04-20 DIAGNOSIS — S335XXA Sprain of ligaments of lumbar spine, initial encounter: Secondary | ICD-10-CM | POA: Diagnosis not present

## 2020-04-20 DIAGNOSIS — M62830 Muscle spasm of back: Secondary | ICD-10-CM | POA: Diagnosis not present

## 2020-04-20 DIAGNOSIS — B079 Viral wart, unspecified: Secondary | ICD-10-CM | POA: Diagnosis not present

## 2020-04-25 DIAGNOSIS — S336XXA Sprain of sacroiliac joint, initial encounter: Secondary | ICD-10-CM | POA: Diagnosis not present

## 2020-04-25 DIAGNOSIS — M545 Low back pain: Secondary | ICD-10-CM | POA: Diagnosis not present

## 2020-04-25 DIAGNOSIS — S335XXA Sprain of ligaments of lumbar spine, initial encounter: Secondary | ICD-10-CM | POA: Diagnosis not present

## 2020-04-25 DIAGNOSIS — M9903 Segmental and somatic dysfunction of lumbar region: Secondary | ICD-10-CM | POA: Diagnosis not present

## 2020-04-25 DIAGNOSIS — M62830 Muscle spasm of back: Secondary | ICD-10-CM | POA: Diagnosis not present

## 2020-04-28 DIAGNOSIS — M9903 Segmental and somatic dysfunction of lumbar region: Secondary | ICD-10-CM | POA: Diagnosis not present

## 2020-04-28 DIAGNOSIS — M545 Low back pain: Secondary | ICD-10-CM | POA: Diagnosis not present

## 2020-04-28 DIAGNOSIS — S335XXA Sprain of ligaments of lumbar spine, initial encounter: Secondary | ICD-10-CM | POA: Diagnosis not present

## 2020-04-28 DIAGNOSIS — M62830 Muscle spasm of back: Secondary | ICD-10-CM | POA: Diagnosis not present

## 2020-05-01 DIAGNOSIS — S335XXA Sprain of ligaments of lumbar spine, initial encounter: Secondary | ICD-10-CM | POA: Diagnosis not present

## 2020-05-01 DIAGNOSIS — M545 Low back pain: Secondary | ICD-10-CM | POA: Diagnosis not present

## 2020-05-01 DIAGNOSIS — M62830 Muscle spasm of back: Secondary | ICD-10-CM | POA: Diagnosis not present

## 2020-05-01 DIAGNOSIS — M9903 Segmental and somatic dysfunction of lumbar region: Secondary | ICD-10-CM | POA: Diagnosis not present

## 2020-06-02 DIAGNOSIS — F428 Other obsessive-compulsive disorder: Secondary | ICD-10-CM | POA: Diagnosis not present

## 2020-06-02 DIAGNOSIS — F331 Major depressive disorder, recurrent, moderate: Secondary | ICD-10-CM | POA: Diagnosis not present

## 2020-07-18 DIAGNOSIS — Z20822 Contact with and (suspected) exposure to covid-19: Secondary | ICD-10-CM | POA: Diagnosis not present

## 2020-07-24 DIAGNOSIS — F428 Other obsessive-compulsive disorder: Secondary | ICD-10-CM | POA: Diagnosis not present

## 2020-08-09 DIAGNOSIS — F3160 Bipolar disorder, current episode mixed, unspecified: Secondary | ICD-10-CM | POA: Diagnosis not present

## 2020-08-09 DIAGNOSIS — Z79899 Other long term (current) drug therapy: Secondary | ICD-10-CM | POA: Diagnosis not present

## 2020-08-09 DIAGNOSIS — F428 Other obsessive-compulsive disorder: Secondary | ICD-10-CM | POA: Diagnosis not present

## 2020-08-30 DIAGNOSIS — Z20822 Contact with and (suspected) exposure to covid-19: Secondary | ICD-10-CM | POA: Diagnosis not present

## 2021-01-13 IMAGING — US US THYROID
2 series · 13 of 25 positions shown · non-contrast
Comparison: None

CLINICAL DATA: Palpable abnormality. Palpable thyroid nodule on
physical examination.

EXAM:
THYROID ULTRASOUND
TECHNIQUE: Ultrasound examination of the thyroid gland and adjacent soft
tissues was performed.

[Series 1: us thyroid · 0.04mm/px · 12 of 40 slices shown (1 of 2)]
[im 1/40]
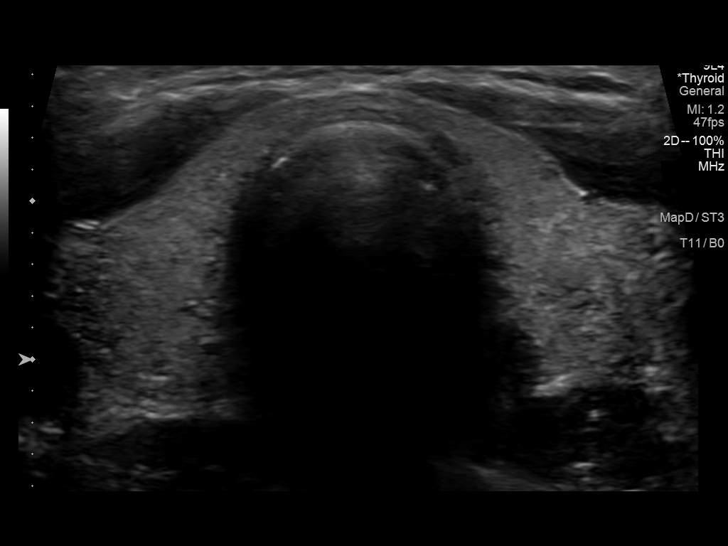
[im 4/40]
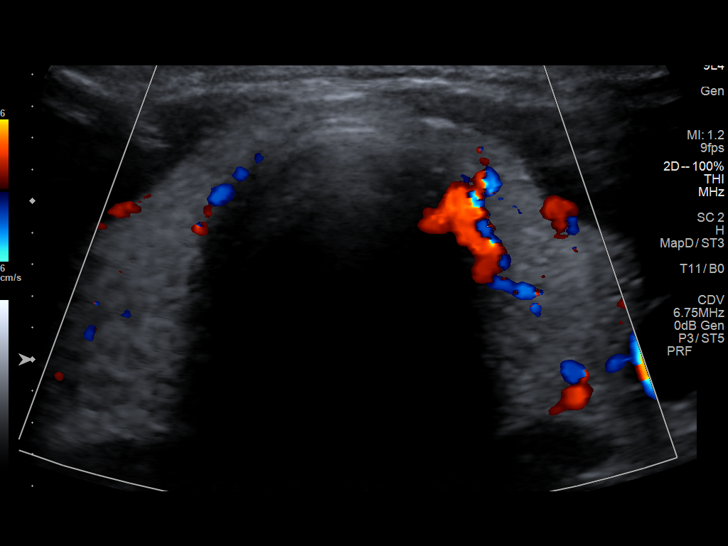
[im 7/40]
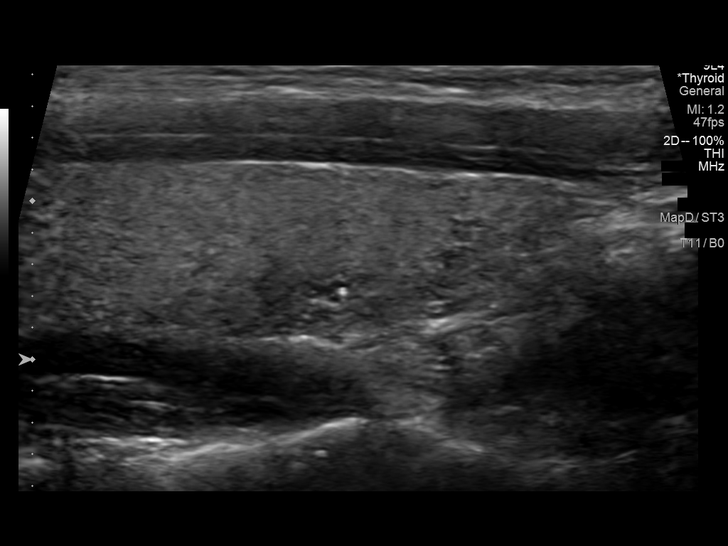
[im 11/40]
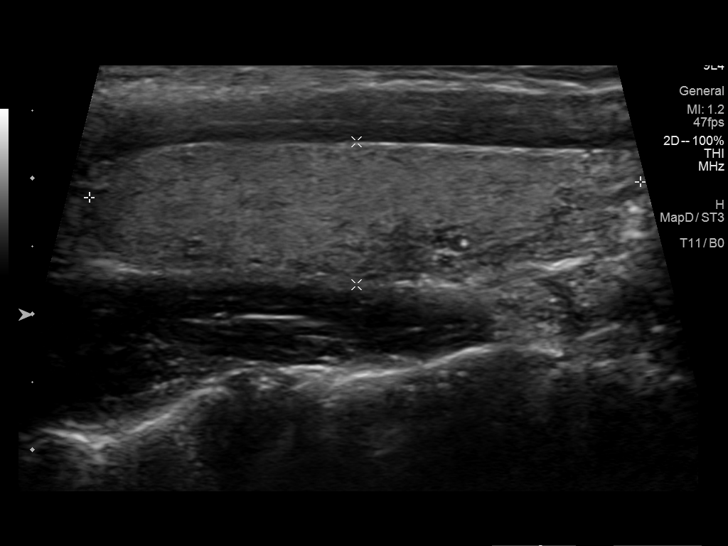
[im 14/40]
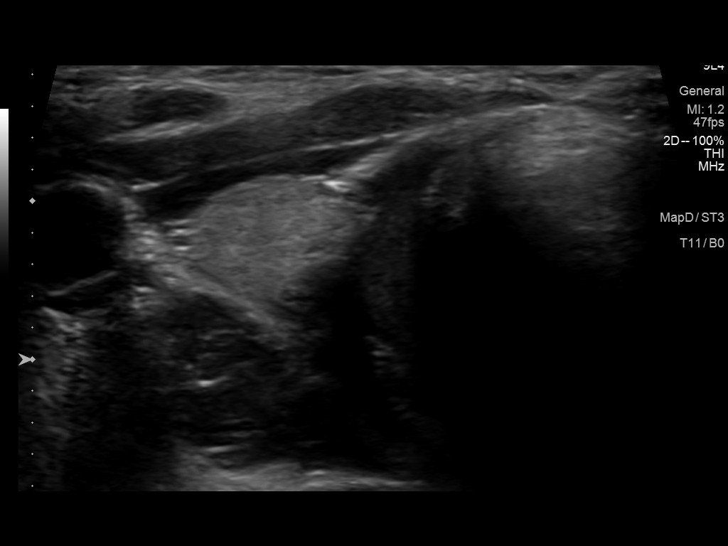
[im 17/40]
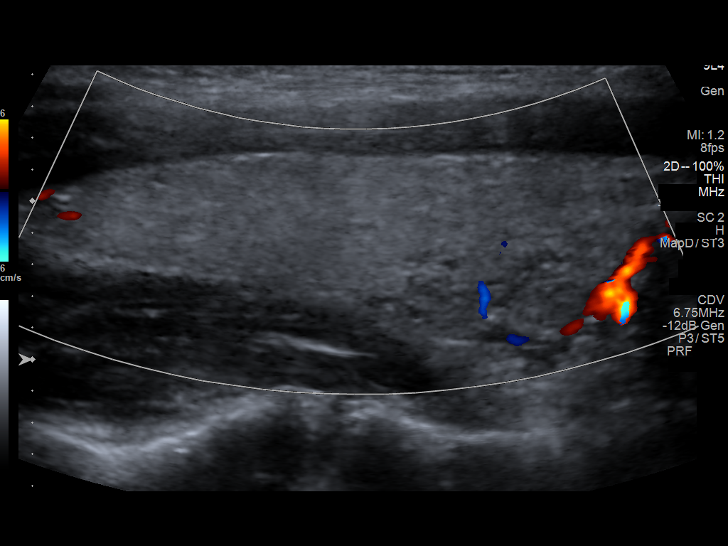
[im 21/40]
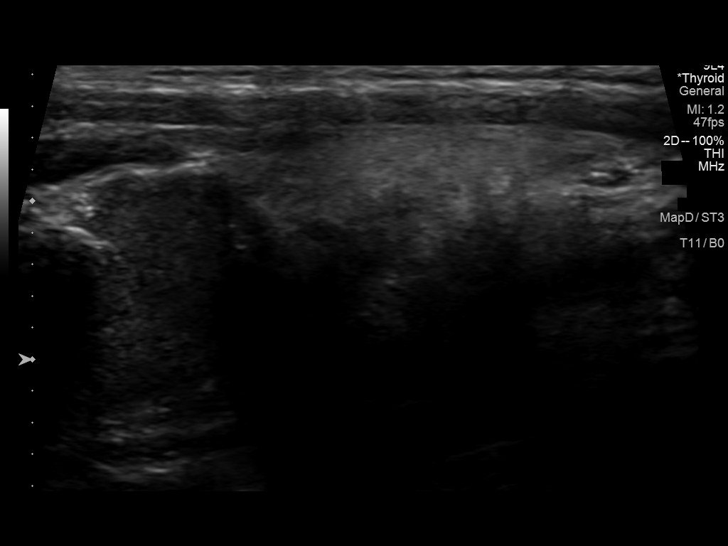
[im 24/40]
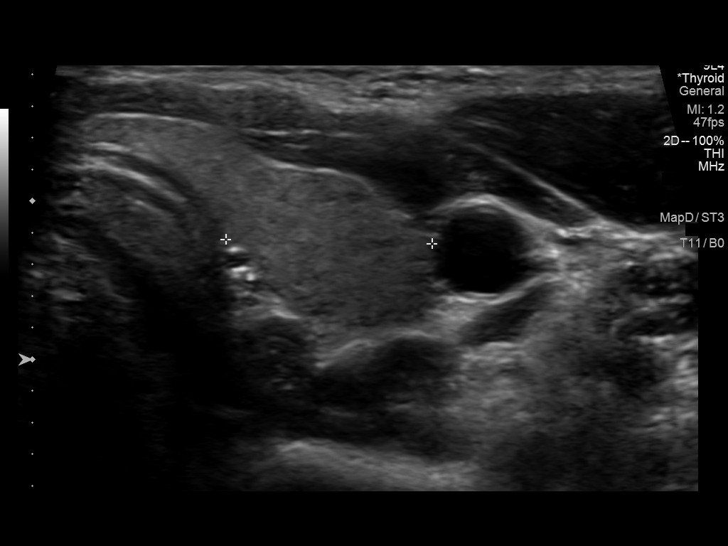
[im 28/40]
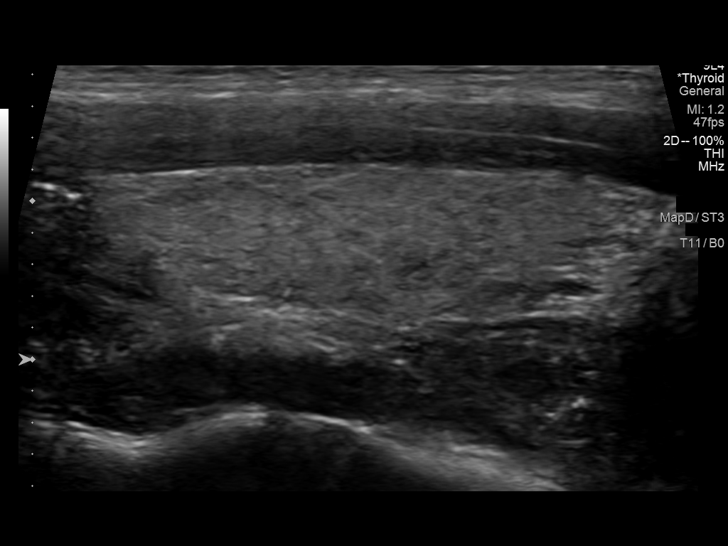
[im 31/40]
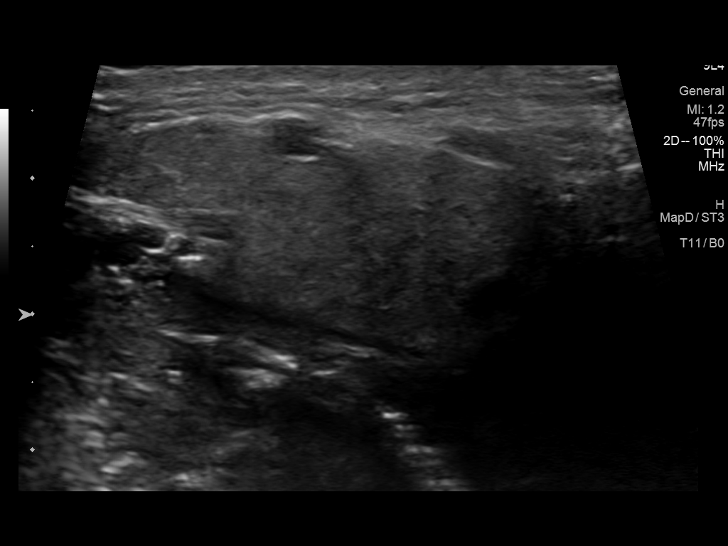
[im 34/40]
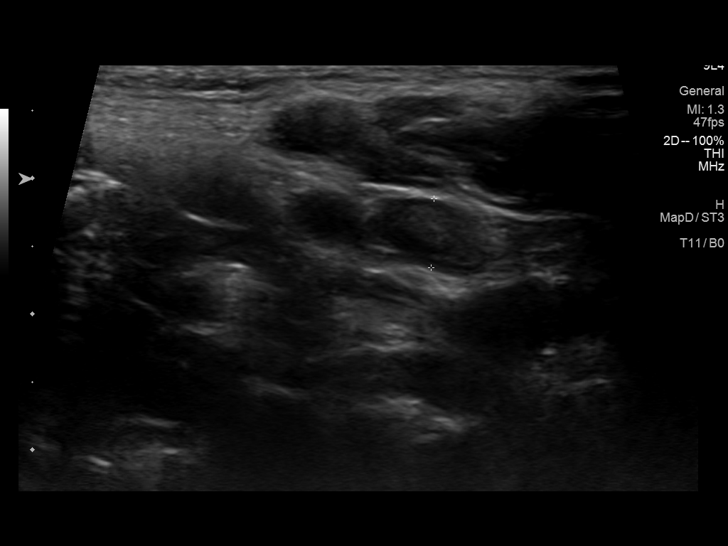
[im 38/40]
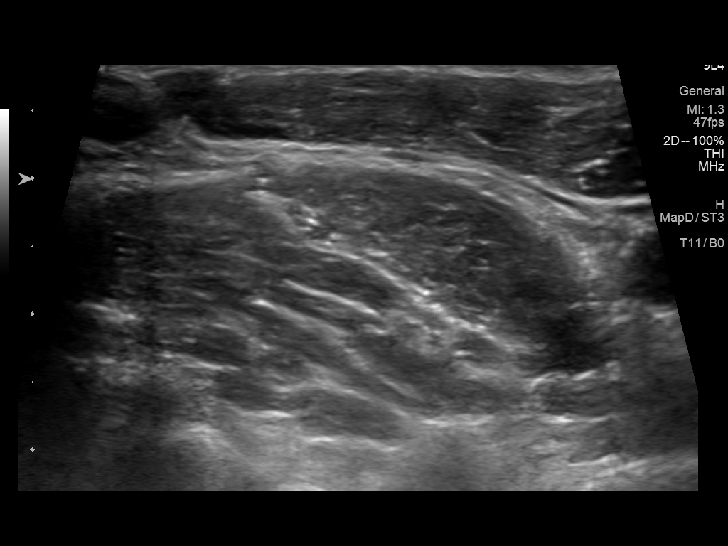

[Series 2001: us thyroid · 0.04mm/px · 1 of 1 slices shown (2 of 2)]
[im 1/1]
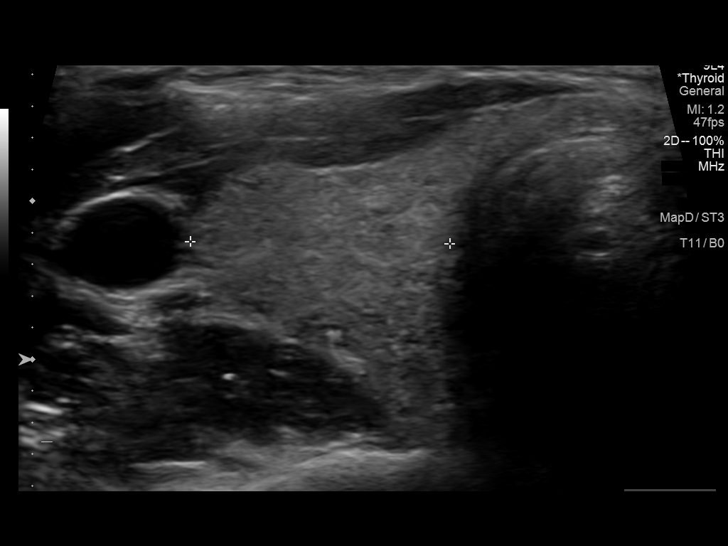

[13 of 25 positions shown; findings below may reference images not displayed]

FINDINGS: Parenchymal Echotexture: Mildly heterogenous

Isthmus: Normal in size measuring 0.2 cm in diameter

Right lobe: Normal in size measuring 4.1 x 1.1 x 1.6 cm

Left lobe: Normal in size measuring 4.1 x 0.8 x 1.3 cm

_________________________________________________________

Estimated total number of nodules >/= 1 cm: 0

Number of spongiform nodules >/=  2 cm not described below (TR1): 0

Number of mixed cystic and solid nodules >/= 1.5 cm not described
below (TR2): 0

_________________________________________________________

Scattered punctate (sub 3 mm) anechoic cysts within both the right
and left lobes of the thyroid, many of which contain internal
echogenic foci ring down artifact compatible with benign colloid.
None of these benign colloid containing cysts meet imaging criteria
to recommend percutaneous sampling or continued dedicated follow-up.

Note is made of a non pathologically enlarged left cervical lymph
node which is not enlarged by size criteria measuring 0.5 cm in
greatest short axis diameter, presumably reactive in etiology.
IMPRESSION: 1. Mildly heterogeneous appearing but normal sized thyroid without
worrisome nodule or mass.
2. Scattered bilateral punctate (sub 3 mm) anechoic cysts, many of
which contain benign colloid, and none of which meet imaging
criteria to recommend percutaneous sampling or continued dedicated
follow-up.

The above is in keeping with the ACR TI-RADS recommendations - [HOSPITAL] 0759;[DATE].
